# Patient Record
Sex: Female | Born: 1960
Health system: Southern US, Community
[De-identification: ages and names within clinical notes are randomized; demographics above are authoritative.]

## PROBLEM LIST (undated history)

## (undated) DIAGNOSIS — I10 Essential (primary) hypertension: Secondary | ICD-10-CM

## (undated) DIAGNOSIS — R519 Headache, unspecified: Secondary | ICD-10-CM

## (undated) DIAGNOSIS — R51 Headache: Secondary | ICD-10-CM

## (undated) DIAGNOSIS — I209 Angina pectoris, unspecified: Secondary | ICD-10-CM

## (undated) HISTORY — PX: TUBAL LIGATION: SHX77

---

## 2001-06-12 ENCOUNTER — Encounter: Payer: Self-pay | Admitting: Family Medicine

## 2001-06-12 ENCOUNTER — Encounter: Admission: RE | Admit: 2001-06-12 | Discharge: 2001-06-12 | Payer: Self-pay | Admitting: Family Medicine

## 2001-07-06 ENCOUNTER — Encounter: Payer: Self-pay | Admitting: Family Medicine

## 2001-07-06 ENCOUNTER — Encounter: Admission: RE | Admit: 2001-07-06 | Discharge: 2001-07-06 | Payer: Self-pay | Admitting: Family Medicine

## 2003-07-29 ENCOUNTER — Encounter: Admission: RE | Admit: 2003-07-29 | Discharge: 2003-07-29 | Payer: Self-pay | Admitting: Family Medicine

## 2004-06-01 ENCOUNTER — Ambulatory Visit: Payer: Self-pay | Admitting: Otolaryngology

## 2004-07-23 ENCOUNTER — Other Ambulatory Visit: Admission: RE | Admit: 2004-07-23 | Discharge: 2004-07-23 | Payer: Self-pay | Admitting: Family Medicine

## 2005-06-04 ENCOUNTER — Encounter: Admission: RE | Admit: 2005-06-04 | Discharge: 2005-06-04 | Payer: Self-pay | Admitting: Family Medicine

## 2005-06-13 ENCOUNTER — Other Ambulatory Visit: Admission: RE | Admit: 2005-06-13 | Discharge: 2005-06-13 | Payer: Self-pay | Admitting: Family Medicine

## 2006-02-06 ENCOUNTER — Ambulatory Visit: Payer: Self-pay | Admitting: Oncology

## 2006-05-06 ENCOUNTER — Ambulatory Visit: Payer: Self-pay | Admitting: Oncology

## 2006-05-08 LAB — CBC & DIFF AND RETIC
BASO%: 0.9 % (ref 0.0–2.0)
Basophils Absolute: 0 10*3/uL (ref 0.0–0.1)
EOS%: 0.7 % (ref 0.0–7.0)
MCH: 30 pg (ref 26.0–34.0)
MONO#: 0.4 10*3/uL (ref 0.1–0.9)
NEUT%: 51 % (ref 39.6–76.8)
Platelets: 283 10*3/uL (ref 145–400)
RBC: 3.3 10*6/uL — ABNORMAL LOW (ref 3.70–5.32)
RDW: 15.3 % — ABNORMAL HIGH (ref 11.3–14.5)
RETIC #: 45.5 10*3/uL (ref 19.7–115.1)
WBC: 2.9 10*3/uL — ABNORMAL LOW (ref 3.9–10.0)
lymph#: 1 10*3/uL (ref 0.9–3.3)

## 2006-05-09 LAB — SPEP & IFE WITH QIG
Albumin ELP: 48.9 % — ABNORMAL LOW (ref 55.8–66.1)
Beta Globulin: 6.1 % (ref 4.7–7.2)
IgA: 277 mg/dL (ref 68–378)
IgM, Serum: 87 mg/dL (ref 60–263)

## 2006-05-09 LAB — IRON AND TIBC
%SAT: 15 % — ABNORMAL LOW (ref 20–55)
TIBC: 394 ug/dL (ref 250–470)

## 2006-05-09 LAB — COMPREHENSIVE METABOLIC PANEL
ALT: 9 U/L (ref 0–40)
Albumin: 4.3 g/dL (ref 3.5–5.2)
BUN: 12 mg/dL (ref 6–23)
CO2: 27 mEq/L (ref 19–32)
Chloride: 101 mEq/L (ref 96–112)
Creatinine, Ser: 0.78 mg/dL (ref 0.40–1.20)
Glucose, Bld: 110 mg/dL — ABNORMAL HIGH (ref 70–99)
Total Protein: 8.3 g/dL (ref 6.0–8.3)

## 2006-05-09 LAB — ERYTHROPOIETIN: Erythropoietin: 33.6 m[IU]/mL (ref 2.6–34.0)

## 2006-06-05 ENCOUNTER — Encounter: Admission: RE | Admit: 2006-06-05 | Discharge: 2006-06-05 | Payer: Self-pay | Admitting: Family Medicine

## 2006-07-29 ENCOUNTER — Ambulatory Visit: Payer: Self-pay | Admitting: Oncology

## 2006-08-26 LAB — CBC WITH DIFFERENTIAL/PLATELET
BASO%: 0.7 % (ref 0.0–2.0)
Basophils Absolute: 0 10*3/uL (ref 0.0–0.1)
HCT: 33.2 % — ABNORMAL LOW (ref 34.8–46.6)
HGB: 11.2 g/dL — ABNORMAL LOW (ref 11.6–15.9)
LYMPH%: 35.6 % (ref 14.0–48.0)
MCH: 31.1 pg (ref 26.0–34.0)
MCHC: 33.6 g/dL (ref 32.0–36.0)
MCV: 92.5 fL (ref 81.0–101.0)
NEUT#: 1.7 10*3/uL (ref 1.5–6.5)
RBC: 3.59 10*6/uL — ABNORMAL LOW (ref 3.70–5.32)
RDW: 13.9 % (ref 11.3–14.5)
WBC: 3.5 10*3/uL — ABNORMAL LOW (ref 3.9–10.0)
lymph#: 1.3 10*3/uL (ref 0.9–3.3)

## 2006-08-26 LAB — IRON AND TIBC: %SAT: 28 % (ref 20–55)

## 2007-06-08 ENCOUNTER — Encounter: Admission: RE | Admit: 2007-06-08 | Discharge: 2007-06-08 | Payer: Self-pay | Admitting: Family Medicine

## 2007-07-28 ENCOUNTER — Other Ambulatory Visit: Admission: RE | Admit: 2007-07-28 | Discharge: 2007-07-28 | Payer: Self-pay | Admitting: Family Medicine

## 2008-06-08 ENCOUNTER — Encounter: Admission: RE | Admit: 2008-06-08 | Discharge: 2008-06-08 | Payer: Self-pay | Admitting: Family Medicine

## 2008-08-01 ENCOUNTER — Other Ambulatory Visit: Admission: RE | Admit: 2008-08-01 | Discharge: 2008-08-01 | Payer: Self-pay | Admitting: Family Medicine

## 2008-09-12 ENCOUNTER — Encounter (HOSPITAL_COMMUNITY): Admission: RE | Admit: 2008-09-12 | Discharge: 2008-12-11 | Payer: Self-pay | Admitting: Family Medicine

## 2008-12-14 ENCOUNTER — Encounter: Admission: RE | Admit: 2008-12-14 | Discharge: 2008-12-14 | Payer: Self-pay | Admitting: Family Medicine

## 2009-06-15 ENCOUNTER — Encounter: Admission: RE | Admit: 2009-06-15 | Discharge: 2009-06-15 | Payer: Self-pay | Admitting: Family Medicine

## 2010-06-07 ENCOUNTER — Encounter: Admission: RE | Admit: 2010-06-07 | Discharge: 2010-06-07 | Payer: Self-pay | Admitting: Family Medicine

## 2011-05-03 ENCOUNTER — Other Ambulatory Visit: Payer: Self-pay | Admitting: Family Medicine

## 2011-05-03 DIAGNOSIS — Z1231 Encounter for screening mammogram for malignant neoplasm of breast: Secondary | ICD-10-CM

## 2011-06-10 ENCOUNTER — Ambulatory Visit
Admission: RE | Admit: 2011-06-10 | Discharge: 2011-06-10 | Disposition: A | Discharge status: Home / Self Care | Payer: Federal, State, Local not specified - PPO | Source: Ambulatory Visit | Attending: Family Medicine | Admitting: Family Medicine

## 2011-06-10 DIAGNOSIS — Z1231 Encounter for screening mammogram for malignant neoplasm of breast: Secondary | ICD-10-CM

## 2012-05-15 ENCOUNTER — Other Ambulatory Visit: Payer: Self-pay | Admitting: Family Medicine

## 2012-05-15 DIAGNOSIS — Z1231 Encounter for screening mammogram for malignant neoplasm of breast: Secondary | ICD-10-CM

## 2012-06-10 ENCOUNTER — Ambulatory Visit: Payer: Federal, State, Local not specified - PPO

## 2012-06-11 ENCOUNTER — Ambulatory Visit
Admission: RE | Admit: 2012-06-11 | Discharge: 2012-06-11 | Disposition: A | Discharge status: Home / Self Care | Payer: Federal, State, Local not specified - PPO | Source: Ambulatory Visit | Attending: Family Medicine | Admitting: Family Medicine

## 2012-06-11 DIAGNOSIS — Z1231 Encounter for screening mammogram for malignant neoplasm of breast: Secondary | ICD-10-CM

## 2012-08-06 ENCOUNTER — Other Ambulatory Visit (HOSPITAL_COMMUNITY)
Admission: RE | Admit: 2012-08-06 | Discharge: 2012-08-06 | Disposition: A | Discharge status: Home / Self Care | Payer: Federal, State, Local not specified - PPO | Source: Ambulatory Visit | Attending: Family Medicine | Admitting: Family Medicine

## 2012-08-06 DIAGNOSIS — Z0000 Encounter for general adult medical examination without abnormal findings: Secondary | ICD-10-CM | POA: Insufficient documentation

## 2013-05-06 ENCOUNTER — Other Ambulatory Visit: Payer: Self-pay

## 2013-05-06 DIAGNOSIS — Z1231 Encounter for screening mammogram for malignant neoplasm of breast: Secondary | ICD-10-CM

## 2013-06-01 ENCOUNTER — Ambulatory Visit
Admission: RE | Admit: 2013-06-01 | Discharge: 2013-06-01 | Disposition: A | Discharge status: Home / Self Care | Payer: Federal, State, Local not specified - PPO | Source: Ambulatory Visit

## 2013-06-01 DIAGNOSIS — Z1231 Encounter for screening mammogram for malignant neoplasm of breast: Secondary | ICD-10-CM

## 2013-06-30 ENCOUNTER — Other Ambulatory Visit: Payer: Self-pay | Admitting: Family Medicine

## 2013-06-30 DIAGNOSIS — N83209 Unspecified ovarian cyst, unspecified side: Secondary | ICD-10-CM

## 2013-07-01 ENCOUNTER — Ambulatory Visit
Admission: RE | Admit: 2013-07-01 | Discharge: 2013-07-01 | Disposition: A | Discharge status: Home / Self Care | Payer: Federal, State, Local not specified - PPO | Source: Ambulatory Visit | Attending: Family Medicine | Admitting: Family Medicine

## 2013-07-01 ENCOUNTER — Other Ambulatory Visit: Payer: Self-pay | Admitting: Family Medicine

## 2013-07-01 DIAGNOSIS — N83209 Unspecified ovarian cyst, unspecified side: Secondary | ICD-10-CM

## 2013-07-02 ENCOUNTER — Other Ambulatory Visit: Payer: Federal, State, Local not specified - PPO

## 2013-12-13 ENCOUNTER — Emergency Department (INDEPENDENT_AMBULATORY_CARE_PROVIDER_SITE_OTHER)
Admission: EM | Admit: 2013-12-13 | Discharge: 2013-12-13 | Disposition: A | Discharge status: Home / Self Care | Payer: Federal, State, Local not specified - PPO | Source: Home / Self Care | Attending: Emergency Medicine | Admitting: Emergency Medicine

## 2013-12-13 ENCOUNTER — Encounter (HOSPITAL_COMMUNITY): Payer: Self-pay | Admitting: Emergency Medicine

## 2013-12-13 ENCOUNTER — Emergency Department (INDEPENDENT_AMBULATORY_CARE_PROVIDER_SITE_OTHER): Payer: Federal, State, Local not specified - PPO

## 2013-12-13 DIAGNOSIS — R079 Chest pain, unspecified: Secondary | ICD-10-CM

## 2013-12-13 MED ORDER — FAMOTIDINE 20 MG PO TABS: 20.0000 mg | ORAL_TABLET | Freq: Two times a day (BID) | ORAL | Status: DC

## 2013-12-13 NOTE — ED Provider Notes (Signed)
Medical screening examination/treatment/procedure(s) were performed by non-physician practitioner and as supervising physician I was immediately available for consultation/collaboration.  Philipp Deputy, M.D.  Harden Mo, MD 12/13/13 [phone]

## 2013-12-13 NOTE — ED Notes (Signed)
C/o chest pain which started yesterday States she had mid chest pain States pain did go away after less than 5 minutes Yesterday she did take a sudafed for her sinus which she does every now and then for her sinus States no heavy lifting States she does work at [employer] Pain is not radiating to any arms Denies any nausea, diarrhea, vomiting States she does have head pressure

## 2013-12-13 NOTE — Discharge Instructions (Signed)
Both you EKG and chest xray were normal. You may have some mild acid reflux and can begin taking Pepcid as prescribed. Please carefully review all discharge instructions below. If symptoms become suddenly worse or severe, report to your nearest ER for immediate re-evaluation. I would like you to contact your primary care physician for follow up evaluation in the next 5-7 days as your blood pressure is elevated here today. Chest Pain (Nonspecific) It is often hard to give a specific diagnosis for the cause of chest pain. There is always a chance that your pain could be related to something serious, such as a heart attack or a blood clot in the lungs. You need to follow up with your caregiver for further evaluation. CAUSES   Heartburn.  Pneumonia or bronchitis.  Anxiety or stress.  Inflammation around your heart (pericarditis) or lung (pleuritis or pleurisy).  A blood clot in the lung.  A collapsed lung (pneumothorax). It can develop suddenly on its own (spontaneous pneumothorax) or from injury (trauma) to the chest.  Shingles infection (herpes zoster virus). The chest wall is composed of bones, muscles, and cartilage. Any of these can be the source of the pain.  The bones can be bruised by injury.  The muscles or cartilage can be strained by coughing or overwork.  The cartilage can be affected by inflammation and become sore (costochondritis). DIAGNOSIS  Lab tests or other studies, such as X-rays, electrocardiography, stress testing, or cardiac imaging, may be needed to find the cause of your pain.  TREATMENT   Treatment depends on what may be causing your chest pain. Treatment may include:  Acid blockers for heartburn.  Anti-inflammatory medicine.  Pain medicine for inflammatory conditions.  Antibiotics if an infection is present.  You may be advised to change lifestyle habits. This includes stopping smoking and avoiding alcohol, caffeine, and chocolate.  You may be advised  to keep your head raised (elevated) when sleeping. This reduces the chance of acid going backward from your stomach into your esophagus.  Most of the time, nonspecific chest pain will improve within 2 to 3 days with rest and mild pain medicine. HOME CARE INSTRUCTIONS   If antibiotics were prescribed, take your antibiotics as directed. Finish them even if you start to feel better.  For the next few days, avoid physical activities that bring on chest pain. Continue physical activities as directed.  Do not smoke.  Avoid drinking alcohol.  Only take over-the-counter or prescription medicine for pain, discomfort, or fever as directed by your caregiver.  Follow your caregiver's suggestions for further testing if your chest pain does not go away.  Keep any follow-up appointments you made. If you do not go to an appointment, you could develop lasting (chronic) problems with pain. If there is any problem keeping an appointment, you must call to reschedule. SEEK MEDICAL CARE IF:   You think you are having problems from the medicine you are taking. Read your medicine instructions carefully.  Your chest pain does not go away, even after treatment.  You develop a rash with blisters on your chest. SEEK IMMEDIATE MEDICAL CARE IF:   You have increased chest pain or pain that spreads to your arm, neck, jaw, back, or abdomen.  You develop shortness of breath, an increasing cough, or you are coughing up blood.  You have severe back or abdominal pain, feel nauseous, or vomit.  You develop severe weakness, fainting, or chills.  You have a fever. THIS IS AN EMERGENCY. Do not  wait to see if the pain will go away. Get medical help at once. Call your local emergency services (911 in [place] Do not drive yourself to the hospital. MAKE SURE YOU:   Understand these instructions.  Will watch your condition.  Will get help right away if you are not doing well or get worse. Document Released: 05/15/2005  Document Revised: 10/28/2011 Document Reviewed: 03/10/2008 [facility] Patient Information 2014 [place].  Chest Pain Observation It is often hard to give a specific diagnosis for the cause of chest pain. Among other possibilities your symptoms might be caused by inadequate oxygen delivery to your heart (angina). Angina that is not treated or evaluated can lead to a heart attack (myocardial infarction) or death. Blood tests, electrocardiograms, and X-rays may have been done to help determine a possible cause of your chest pain. After evaluation and observation, your health care provider has determined that it is unlikely your pain was caused by an unstable condition that requires hospitalization. However, a full evaluation of your pain may need to be completed, with additional diagnostic testing as directed. It is very important to keep your follow-up appointments. Not keeping your follow-up appointments could result in permanent heart damage, disability, or death. If there is any problem keeping your follow-up appointments, you must call your health care provider. HOME CARE INSTRUCTIONS  Due to the slight chance that your pain could be angina, it is important to follow your health care provider's treatment plan and also maintain a healthy lifestyle:  Maintain or work toward achieving a healthy weight.  Stay physically active and exercise regularly.  Decrease your salt intake.  Eat a balanced, healthy diet. Talk to a dietician to learn about heart healthy foods.  Increase your fiber intake by including whole grains, vegetables, fruits, and nuts in your diet.  Avoid situations that cause stress, anger, or depression.  Take medicines as advised by your health care provider. Report any side effects to your health care provider. Do not stop medicines or adjust the dosages on your own.  Quit smoking. Do not use nicotine patches or gum until you check with your health care provider.  Keep your  blood pressure, blood sugar, and cholesterol levels within normal limits.  Limit alcohol intake to no more than 1 drink per day for women that are not pregnant and 2 drinks per day for men.  Do not abuse drugs. SEEK IMMEDIATE MEDICAL CARE IF: You have severe chest pain or pressure which may include symptoms such as:  You feel pain or pressure in you arms, neck, jaw, or back.  You have severe back or abdominal pain, feel sick to your stomach (nauseous), or throw up (vomit).  You are sweating profusely.  You are having a fast or irregular heartbeat.  You feel short of breath while at rest.  You notice increasing shortness of breath during rest, sleep, or with activity.  You have chest pain that does not get better after rest or after taking your usual medicine.  You wake from sleep with chest pain.  You are unable to sleep because you cannot breathe.  You develop a frequent cough or you are coughing up blood.  You feel dizzy, faint, or experience extreme fatigue.  You develop severe weakness, dizziness, fainting, or chills. Any of these symptoms may represent a serious problem that is an emergency. Do not wait to see if the symptoms will go away. Call your local emergency services (911 in the [place] Do not drive yourself  to the hospital. MAKE SURE YOU:  Understand these instructions.  Will watch your condition.  Will get help right away if you are not doing well or get worse. Document Released: 09/07/2010 Document Revised: 04/07/2013 Document Reviewed: 02/04/2013 [facility] Patient Information 2014 [place], [place].  Heart Attack in Women Heart attack (myocardial infarction) is one of the leading causes of sudden, unexpected death in women. A heart attack is damage to the heart that is not reversible. A heart attack usually occurs when a heart (coronary) artery becomes narrowed or blocked. The blockage cuts off blood supply to the heart muscle. When one or more of the coronary  arteries becomes blocked, that area of the heart begins to die. This can cause pain felt during a heart attack.  If you think you might be having a heart attack, do not ignore your symptoms. Call your local emergency services (911 in [place] immediately. It is recommended that you take a 162 mg non-enteric coated aspirin if you do not have an aspirin allergy. Do not drive yourself to the hospital or wait to see if your symptoms go away. Early recognition of heart attack symptoms is critical. The sooner a heart attack is treated, the greater the amount of heart muscle saved. Time is muscle. It can save your life. CAUSES  A heart attack can occur from coronary artery disease (CAD). CAD is a process in which the coronary arteries narrow or become blocked from the development of atherosclerosis. Atherosclerosis is a disease in which plaque builds up on the inside of the coronary arteries. Plaque is made up of fats (lipids), cholesterol, calcium, and fibrous tissue. A heart attack can occur due to:  Plaque buildup that can severely narrow or block the coronary arteries and diminish blood flow.  Plaque that can become unstable and "rupture." Unstable plaque that ruptures within a coronary artery can form a clot and cause a sudden (acute) blockage of the coronary artery.  A severe tightening (spasm) of the coronary artery. This is a less common cause of a heart attack. When a coronary artery spasms, it cuts off blood flow through the coronary artery. Spasms can occur in coronary arteries that do not have atherosclerosis. RISK FACTORS In women, as the level of estrogen in the blood decreases after menopause, the risk of a heart attack increases. Other risk factors of heart attack in women include:  High blood pressure.  High cholesterol levels.  Menopause.  Smoking.  Obesity.  Diabetes.  Hysterectomy.  Previous heart attack.  Lack of regular exercise.  Family history of heart attacks. SYMPTOMS    In women, heart attack symptoms may be different than those in men. Women may not experience the typical chest discomfort or pain, which is considered the primary heart attack symptom in men. Women may describe a feeling of pressure, ache, or tightness in the chest. Women may experience new or different physical symptoms 1 month or more before a heart attack. Unusual, unexplained fatigue may be the most frequently identified symptom. Sleep disturbances and weakness in the arms may also be considered warning signs.  Other heart attack symptoms that may occur more often in women are:  Unexplained feelings of nervousness or anxiety.  Discomfort between the shoulder blades.  Tingling in the hands and arms.  Swollen arms.  Headaches. Heart attack symptoms for both men and women include:  Pain or discomfort spreading to the neck, shoulder, arm, or jaw.  Shortness of breath.  Sudden cold sweats.  Pain or discomfort  in the abdomen.  Heartburn or indigestion with or without vomiting.  Sudden lightheadedness.  Sudden fainting or blackout. PREVENTION The following healthy lifestyle habits may help decrease your risk of heart attacks:  Quitting smoking.  Keeping your blood pressure, blood sugar, and cholesterol levels within normal limits.  Maintaining a healthy weight.  Staying physically active and exercising regularly.  Decreasing your salt intake.  Eating a diet low in saturated fats and cholesterol.  Increasing your fiber intake by including whole grains, vegetables, and fruits in your diet.  Avoiding situations that cause stress, anger, or depression.  Taking medicine as advised by your caregiver. SEEK IMMEDIATE MEDICAL CARE IF:   You have severe chest pain, especially if the pain is crushing or pressure-like and spreads to the arms, back, neck, or jaw. This is an emergency. Do not wait to see if the pain will go away. Call your local emergency services (911 in [place]  immediately. Do not drive yourself to the hospital.  You develop shortness of breath during rest, sleep, or with activity.  You have sudden, unexplained sweating or clammy skin.  You feel nauseous or vomit without cause.  You become lightheaded or dizzy.  You feel your heart beating rapidly or you notice your heart "skipping" beats. MAKE SURE YOU:   Understand these instructions.  Will watch your condition.  Will get help right away if you are not doing well or get worse. Document Released: 02/01/2008 Document Revised: 02/04/2012 Document Reviewed: 11/07/2011 [facility] Patient Information 2014 [place].  Pain of Unknown Etiology (Pain Without a Known Cause) You have come to your caregiver because of pain. Pain can occur in any part of the body. Often there is not a definite cause. If your laboratory (blood or urine) work was normal and X-rays or other studies were normal, your caregiver may treat you without knowing the cause of the pain. An example of this is the headache. Most headaches are diagnosed by taking a history. This means your caregiver asks you questions about your headaches. Your caregiver determines a treatment based on your answers. Usually testing done for headaches is normal. Often testing is not done unless there is no response to medications. Regardless of where your pain is located today, you can be given medications to make you comfortable. If no physical cause of pain can be found, most cases of pain will gradually leave as suddenly as they came.  If you have a painful condition and no reason can be found for the pain, it is important that you follow up with your caregiver. If the pain becomes worse or does not go away, it may be necessary to repeat tests and look further for a possible cause.  Only take over-the-counter or prescription medicines for pain, discomfort, or fever as directed by your caregiver.  For the protection of your privacy, test results  cannot be given over the phone. Make sure you receive the results of your test. Ask how these results are to be obtained if you have not been informed. It is your responsibility to obtain your test results.  You may continue all activities unless the activities cause more pain. When the pain lessens, it is important to gradually resume normal activities. Resume activities by beginning slowly and gradually increasing the intensity and duration of the activities or exercise. During periods of severe pain, bed rest may be helpful. Lie or sit in any position that is comfortable.  Ice used for acute (sudden) conditions may be effective.  Use a large plastic bag filled with ice and wrapped in a towel. This may provide pain relief.  See your caregiver for continued problems. Your caregiver can help or refer you for exercises or physical therapy if necessary. If you were given medications for your condition, do not drive, operate machinery or power tools, or sign legal documents for 24 hours. Do not drink alcohol, take sleeping pills, or take other medications that may interfere with treatment. See your caregiver immediately if you have pain that is becoming worse and not relieved by medications. Document Released: 04/30/2001 Document Revised: 05/26/2013 Document Reviewed: 08/05/2005 [facility] Patient Information 2014 [place].

## 2013-12-13 NOTE — ED Provider Notes (Signed)
CSN: [id number]     Arrival date & time 12/13/13  3086 History   First MD Initiated Contact with Patient 12/13/13 0845     Chief Complaint  Patient presents with  . Chest Pain   (Consider location/radiation/quality/duration/timing/severity/associated sxs/prior Treatment) HPI Comments: Patient report a 3-5 minute episode of dull substernal chest discomfort yesterday while at church. Decided to leave church early as a result but discomfort had resolved spontaneously before arriving home. States she had a second similar episode this morning while getting ready for work. It also lasted about 3 minutes and resolved PTA. No associated dyspnea, diaphoresis, nausea, cough, fever, pedal edema, palpitations, dizziness, or syncope associated with either episode. Pain does not change with exertion.  No known personal hx of CAD. Family HX: father had fatal MI in his mid-60's Also mentions that over the weekend she ate sausage, biscuits, pizza, and hot wings.  Works at [employer]. Patient is a non-smoker.  PCP: Dr. Deland Pretty Reports herself to be symptom-free at time of exam. Symptoms do not seem to be precipitated by exertion.   Patient is a 53 y.o. female presenting with chest pain. The history is provided by the patient.  Chest Pain Pain location:  Substernal area Pain quality: dull   Pain radiates to:  Does not radiate Pain radiates to the back: no   Pain severity:  Mild Onset quality:  Gradual Duration:  3 minutes Progression:  Resolved Chronicity:  New Associated symptoms: no cough, no dizziness, no headache, no shortness of breath and no weakness     History reviewed. No pertinent past medical history. No past surgical history on file. No family history on file. History  Substance Use Topics  . Smoking status: Not on file  . Smokeless tobacco: Not on file  . Alcohol Use: Not on file   OB History   Grav Para Term Preterm Abortions TAB SAB Ect Mult Living                 Review of  Systems  Constitutional: Negative.   HENT: Positive for sinus pressure.   Eyes: Negative.   Respiratory: Positive for chest tightness. Negative for cough, shortness of breath, wheezing and stridor.   Cardiovascular: Positive for chest pain.  Gastrointestinal: Negative.   Endocrine: Negative for polydipsia, polyphagia and polyuria.  Genitourinary: Negative.   Musculoskeletal: Negative.   Skin: Negative.   Neurological: Negative for dizziness, seizures, syncope, weakness, light-headedness and headaches.  Hematological: Negative for adenopathy.    Allergies  Rubbing alcohol  Home Medications   Prior to Admission medications   Not on File   BP 156/98  Pulse 78  Temp(Src) 98.2 F (36.8 C) (Oral)  Resp 12  SpO2 97% Physical Exam  Nursing note and vitals reviewed. Constitutional: She is oriented to person, place, and time. She appears well-developed and well-nourished.  +hypertensive  HENT:  Head: Normocephalic and atraumatic.  Eyes: Conjunctivae are normal. No scleral icterus.  Neck: Normal range of motion. Neck supple. No JVD present. No thyromegaly present.  Cardiovascular: Normal rate, regular rhythm and normal heart sounds.   Pulmonary/Chest: Effort normal and breath sounds normal. No respiratory distress. She has no wheezes.  Abdominal: Soft. Bowel sounds are normal. She exhibits no distension. There is no tenderness.  Musculoskeletal: Normal range of motion. She exhibits no edema and no tenderness.  Neurological: She is alert and oriented to person, place, and time.  Skin: Skin is warm and dry.  Psychiatric: She has a normal mood  and affect. Her behavior is normal.    ED Course  Procedures (including critical care time) Labs Review Labs Reviewed - No data to display  Imaging Review Dg Chest 2 View  12/13/2013   CLINICAL DATA:  Chest pain  EXAM: CHEST  2 VIEW  COMPARISON:  None.  FINDINGS: The heart size and mediastinal contours are within normal limits. Both lungs  are clear. The visualized skeletal structures are unremarkable.  IMPRESSION: No active cardiopulmonary disease.   Electronically Signed   By: Daryll Brod M.D.   On: 12/13/2013 09:32     MDM   1. Nonspecific chest pain    ECG: NSR at 75 bmp. No acute ST/T wave changes or ectopy.  CXR: normal. Will suggest that patient begin taking Pepcid BID as prescribed and contact PCP for follow up evaluation should symptoms re-occur. Also recommended PCP follow up for elevated BP.  Hx and exam do not seem to suggest new onset or unstable angina or pulmonary embolism.  Patient understand that if symptoms become suddenly worse, persistent or associated with dyspnea, dizziness, diaphoresis, weakness, or nausea, she is to seek immediate medical attention at her nearest ER.   [place], [place] 12/13/13 [phone]

## 2014-04-29 ENCOUNTER — Other Ambulatory Visit: Payer: Self-pay

## 2014-04-29 DIAGNOSIS — Z1231 Encounter for screening mammogram for malignant neoplasm of breast: Secondary | ICD-10-CM

## 2014-05-19 ENCOUNTER — Ambulatory Visit
Admission: RE | Admit: 2014-05-19 | Discharge: 2014-05-19 | Disposition: A | Discharge status: Home / Self Care | Payer: Federal, State, Local not specified - PPO | Source: Ambulatory Visit

## 2014-05-19 DIAGNOSIS — Z1231 Encounter for screening mammogram for malignant neoplasm of breast: Secondary | ICD-10-CM

## 2015-01-10 ENCOUNTER — Other Ambulatory Visit: Payer: Self-pay | Admitting: Family Medicine

## 2015-01-10 ENCOUNTER — Other Ambulatory Visit (HOSPITAL_COMMUNITY)
Admission: RE | Admit: 2015-01-10 | Discharge: 2015-01-10 | Disposition: A | Discharge status: Home / Self Care | Payer: Federal, State, Local not specified - PPO | Source: Ambulatory Visit | Attending: Family Medicine | Admitting: Family Medicine

## 2015-01-10 DIAGNOSIS — Z01419 Encounter for gynecological examination (general) (routine) without abnormal findings: Secondary | ICD-10-CM | POA: Insufficient documentation

## 2015-01-10 DIAGNOSIS — E010 Iodine-deficiency related diffuse (endemic) goiter: Secondary | ICD-10-CM

## 2015-01-12 LAB — CYTOLOGY - PAP

## 2015-01-17 ENCOUNTER — Other Ambulatory Visit: Payer: Federal, State, Local not specified - PPO

## 2015-01-20 ENCOUNTER — Other Ambulatory Visit: Payer: Federal, State, Local not specified - PPO

## 2015-03-16 ENCOUNTER — Ambulatory Visit
Admission: RE | Admit: 2015-03-16 | Discharge: 2015-03-16 | Disposition: A | Discharge status: Home / Self Care | Payer: Federal, State, Local not specified - PPO | Source: Ambulatory Visit | Attending: Family Medicine | Admitting: Family Medicine

## 2015-03-16 DIAGNOSIS — E010 Iodine-deficiency related diffuse (endemic) goiter: Secondary | ICD-10-CM

## 2015-03-20 ENCOUNTER — Other Ambulatory Visit: Payer: Self-pay | Admitting: Family Medicine

## 2015-03-20 DIAGNOSIS — E041 Nontoxic single thyroid nodule: Secondary | ICD-10-CM

## 2015-04-06 ENCOUNTER — Inpatient Hospital Stay: Admission: RE | Admit: 2015-04-06 | Payer: Federal, State, Local not specified - PPO | Source: Ambulatory Visit

## 2015-04-11 ENCOUNTER — Other Ambulatory Visit: Payer: Federal, State, Local not specified - PPO

## 2015-05-16 ENCOUNTER — Other Ambulatory Visit: Payer: Self-pay

## 2015-05-16 DIAGNOSIS — Z1231 Encounter for screening mammogram for malignant neoplasm of breast: Secondary | ICD-10-CM

## 2015-05-25 ENCOUNTER — Ambulatory Visit: Payer: Federal, State, Local not specified - PPO

## 2015-06-02 ENCOUNTER — Ambulatory Visit
Admission: RE | Admit: 2015-06-02 | Discharge: 2015-06-02 | Disposition: A | Discharge status: Home / Self Care | Payer: Federal, State, Local not specified - PPO | Source: Ambulatory Visit

## 2015-06-02 DIAGNOSIS — Z1231 Encounter for screening mammogram for malignant neoplasm of breast: Secondary | ICD-10-CM

## 2015-07-18 ENCOUNTER — Ambulatory Visit (HOSPITAL_COMMUNITY)
Admission: RE | Admit: 2015-07-18 | Payer: Federal, State, Local not specified - PPO | Source: Ambulatory Visit | Admitting: Obstetrics and Gynecology

## 2015-07-18 ENCOUNTER — Encounter (HOSPITAL_COMMUNITY): Admission: RE | Payer: Self-pay | Source: Ambulatory Visit

## 2015-07-18 SURGERY — SALPINGO-OOPHORECTOMY, UNILATERAL, LAPAROSCOPIC
Anesthesia: Choice | Laterality: Right

## 2015-08-15 ENCOUNTER — Encounter (HOSPITAL_COMMUNITY): Payer: Self-pay

## 2015-08-15 ENCOUNTER — Encounter (HOSPITAL_COMMUNITY)
Admission: RE | Admit: 2015-08-15 | Discharge: 2015-08-15 | Disposition: A | Discharge status: Home / Self Care | Payer: Federal, State, Local not specified - PPO | Source: Ambulatory Visit | Attending: Obstetrics and Gynecology | Admitting: Obstetrics and Gynecology

## 2015-08-15 ENCOUNTER — Other Ambulatory Visit: Payer: Self-pay

## 2015-08-15 ENCOUNTER — Inpatient Hospital Stay (HOSPITAL_COMMUNITY): Admission: RE | Admit: 2015-08-15 | Payer: Federal, State, Local not specified - PPO | Source: Ambulatory Visit

## 2015-08-15 DIAGNOSIS — N83201 Unspecified ovarian cyst, right side: Secondary | ICD-10-CM | POA: Insufficient documentation

## 2015-08-15 DIAGNOSIS — D259 Leiomyoma of uterus, unspecified: Secondary | ICD-10-CM | POA: Insufficient documentation

## 2015-08-15 DIAGNOSIS — Z01818 Encounter for other preprocedural examination: Secondary | ICD-10-CM | POA: Insufficient documentation

## 2015-08-15 HISTORY — DX: Headache, unspecified: R51.9

## 2015-08-15 HISTORY — DX: Angina pectoris, unspecified: I20.9

## 2015-08-15 HISTORY — DX: Headache: R51

## 2015-08-15 NOTE — Patient Instructions (Signed)
            Your procedure is scheduled on: Aug 22 2014  Enter through the Main Entrance of [facility] at: [place] up the phone at the desk and dial [phone] and inform us of your arrival.  Please call this number if you have any problems the morning of surgery: [phone]  Remember: Do not eat food after midnight: JAN 3 (TUESDAY) Do not drink clear liquids after:  [place]  Take these medicines the morning of surgery with a SIP OF WATER:  Do not wear jewelry, make-up, or FINGER nail polish No metal in your hair or on your body. Do not wear lotions, powders, perfumes.  You may wear deodorant.  Do not bring valuables to the hospital. Contacts, dentures or bridgework may not be worn into surgery.  Leave suitcase in the car. After Surgery it may be brought to your room. For patients being admitted to the hospital, checkout time is 11:00am the day of discharge.

## 2015-08-22 ENCOUNTER — Other Ambulatory Visit (HOSPITAL_COMMUNITY): Payer: Self-pay | Admitting: Obstetrics and Gynecology

## 2015-08-29 ENCOUNTER — Other Ambulatory Visit (HOSPITAL_COMMUNITY): Payer: Self-pay | Admitting: Obstetrics and Gynecology

## 2015-08-29 NOTE — H&P (Signed)
Subjective:  Chief Complaint(s):   PreOp for 08/30/15   HPI:  General 55 y/o presents for preop visit in preparation for LAVH/BSO scheduled for 08/30/2015 due to fibroids and ovarian cyst. her ultrasound from 04/06/2015 shows a 7.5 cm x 4.4 cm x 3.7 cm uterus. the endometrium is 1.7 mm. Her right ovary has a simple cyst that is 3.8 cm x 3.6 cm x 2.7 cm. stable is size form previous u/s 06/06/2014. there is a solid mass on the right ovary that is 2.5 cm x 2.4 cm x 2.4 cm tha thas increased in size form previous ultrasound both are avascular. Free fluid is seen in the culdesac and the right adnexa.  CA 125 performed 05/2015 was 10.6.  Current Medication:  Taking  Losartan Potassium-HCTZ 50-12.5 MG Tablet 0.5 tablet Once a day     Biotin 1000 MCG Tablet 1 tablet Once a day     Imitrex(SUMAtriptan) 50 MG Tablet 1 tablet prn ha, mary repeat once in 2 hours as directed, Notes: prn   Not-Taking/PRN  Vitamin D 5000 Tablet 1 tablet Once a day     Tylenol Sinus Congestion/Pain(Chlorphen-PE-Acetaminophen) 5-325 MG Tablet as directed as needed, Notes: prn     Sudafed Sinus(Pseudoephedrine-Acetaminophen) as needed, Notes: prn     Medication List reviewed and reconciled with the patient   Medical History:   HTN     Tinea Vesicolor     Migraines     eczema     fibroids, right ovarian cyst on u/s 12/11, needs f/u u/s 1 year, being followed by Dr Landry Mellow 10/15, needs 1 yr f/u     thyromegally     vitamin D deficiency   Allergies/Intolerance:   Alcohol: Allergy - rash   Gyn History:   Sexual activity currently sexually active. Periods : postmenopausal. Last pap smear date 01/10/15. Last mammogram date 05/19/14. Denies Abnormal pap smear. Denies STD none. Menarche 11.   OB History:   Number of pregnancies 3. Pregnancy # 1 live birth, vaginal delivery, boy 76. Pregnancy # 2 live birth, vaginal delivery, boy 9. Pregnancy # 3 live birth twin girls , vaginal delivery 1995.   Surgical History:   BTL   Hospitalization:   childbirth 20, 33, 95   Family History:   Father: deceased 71 yrs, CAD 69, CVA 65s, DM, HTN. MI, diagnosed with DM, HTN, CVA, CAD    Mother: alive 32 yrs, HTN, DM, diagnosed with DM, HTN    Paternal Grand Father: deceased    Paternal Grand Mother: deceased    Maternal Grand Father: deceased    Maternal Grand Mother: deceased, female CA, HTN, DM, diagnosed with DM, HTN    Brother 1: alive 48 yrs, healthy    Sister 1: alive 65 yrs, healthy    Sister 2: alive 24 yrs, HTN, diagnosed with HTN    1 brother(s) , 2 sister(s) .    denies family hx colon cancer, colon polyops or liver disease.  Social History:  General Tobacco use cigarettes: Never smoked, Tobacco history last updated 08/17/2015.  no EXPOSURE TO PASSIVE SMOKE, no.  no Alcohol, no.  no Caffeine, None.  no Recreational drug use, no.  Exercise: 5-7 times per week.  Marital Status: married, Married [place].  Children: Legrand [facility]), [place] ([address] [place]), Sarah ([employer]) and [place] ([place]) (twins).  OCCUPATION: employed, [employer], husband at [employer].  Religion: Astronomer.  Seat belt use: yes.  ROS: CONSTITUTIONAL none" options="no,yes" propid="91" itemid="[id number]" categoryid="10464" encounterid="[id number]"Fatigue none.  none today" options="no,yes" propid="91" itemid="10467" categoryid="10464" encounterid="[id number]"Fever none today.  CARDIOLOGY none" options="no,yes" propid="91" itemid="[id number]" categoryid="10488" encounterid="[id number]"Chest pain none.  RESPIRATORY no" options="no" propid="91" itemid="270013" categoryid="138132" encounterid="[id number]"Shortness of breath no. no" options="no,yes" propid="91" itemid="172745" categoryid="138132" encounterid="[id number]"Cough no.  GASTROENTEROLOGY none" options="no,yes" propid="91" itemid="193447" categoryid="10494" encounterid="[id number]"Appetite change none. no" options="no,yes" propid="91" itemid="193449"  categoryid="10494" encounterid="[id number]"Change in bowel habits no.  FEMALE REPRODUCTIVE no" options="no,yes" propid="91" itemid="196298" categoryid="10525" encounterid="[id number]"Breast lumps or discharge no. none" options="no,yes" propid="91" itemid="186083" categoryid="10525" encounterid="[id number]"Breast pain none. none" options="no,yes" propid="91" itemid="138198" categoryid="10525" encounterid="[id number]"Dyspareunia none. no" options="no,yes" propid="91" itemid="202654" categoryid="10525" encounterid="[id number]"Dysuria no. none" options="no,yes" propid="91" itemid="186082" categoryid="10525" encounterid="[id number]"Pelvic pain none. NA" options="no,yes" propid="91" itemid="199173" categoryid="10525" encounterid="[id number]"Regular menses NA. no" options="no,yes" propid="91" itemid="278230" categoryid="10525" encounterid="[id number]"Unusual vaginal discharge no. no" options="no,yes" propid="91" itemid="278942" categoryid="10525" encounterid="[id number]"Vaginal itching no. no" options="no,yes" propid="91" itemid="278837" categoryid="10525" encounterid="[id number]"Vulvar/labial lesion no.  NEUROLOGY none" options="no,yes" propid="91" itemid="193627" categoryid="12512" encounterid="[id number]"Migraines none. none" options="no,yes" propid="91" itemid="12514" categoryid="12512" encounterid="[id number]"Tingling/numbness none. none" options="no,yes" propid="91" itemid="193467" categoryid="12512" encounterid="[id number]"Visual changes none.  PSYCHOLOGY no" options="" propid="91" itemid="275919" categoryid="10520" encounterid="[id number]"Depression no.  SKIN no" options="no,yes" propid="91" itemid="269383" categoryid="202750" encounterid="[id number]"Rash no. no" options="no,yes" propid="91" itemid="202757" categoryid="202750" encounterid="[id number]"Suspicious lesions no.  ENDOCRINOLOGY none" options="no,yes" propid="91" itemid="202624" categoryid="12508" encounterid="[id number]"Hot flashes none. no unintentional" options="no,yes" propid="91"  itemid="193436" categoryid="12508" encounterid="[id number]"Weight gain no unintentional. none" options="no,yes" propid="91" itemid="138164" categoryid="12508" encounterid="[id number]"Weight loss none.  HEMATOLOGY/LYMPH no" options="no,yes" propid="91" itemid="193454" categoryid="138157" encounterid="[id number]"Anemia no.    Objective:  Vitals:  Wt 140, Wt change -2 lb, Pulse sitting 82, BP sitting 118/79  Past Results:  Examination:  General Examination McCoy,Tiffany 08/17/2015 03:01:21 PM &gt; , for pelvic exam only" categoryPropId="[id number]" examid="[id number]"CHAPERONE PRESENT McCoy,Tiffany 08/17/2015 03:01:21 PM > , for pelvic exam only.  Physical Examination: GENERAL in NAD, pleasant"Patient appears in NAD, pleasant. well developed"Build: well developed. well-appearing. well-developed"General Appearance: well-appearing. well-developed. african-american"Race: african-american.  LUNGS clear to auscultation"Breath sounds: clear to auscultation. no"Dyspnea: no.  HEART none"Murmurs: none. normal"Rate: normal. regular"Rhythm: regular.  ABDOMEN no masses,tenderness,organomegaly, no CVAT"General: no masses,tenderness,organomegaly, no CVAT.  FEMALE GENITOURINARY no mass, non tender"Adnexa: no mass, non tender. normal, no lesions"Anus/perineum: normal, no lesions. normal appearance , no lesions/discharge/bleeding,good pelvic support , external os normal "Cervix/ cuff: normal appearance , no lesions/discharge/bleeding,good pelvic support , external os normal . normal, no lesions, no skin discoloration, no lymphadenopathy"External genitalia: normal, no lesions, no skin discoloration, no lymphadenopathy. normal external meatus"Urethra: normal external meatus. normal size/shape/consistency, freely mobile, non tender"Uterus: normal size/shape/consistency, freely mobile, non tender. deferred"Rectum: deferred. pink/moist mucosa, no lesions, no abnormal discharge, odorless"Vagina: pink/moist mucosa, no lesions, no abnormal  discharge, odorless. normal, no lesions, no skin discoloration, non tender"Vulva: normal, no lesions, no skin discoloration, non tender.  EXTREMITIES FROM of all extremities"Extremities FROM of all extremities.  NEUROLOGICAL normal"Gait: normal. alert and oriented x 3"Orientation: alert and oriented x 3.    Assessment:  Assessment:  Unspecified ovarian cyst, right side - N83.201 (Primary)     Leiomyoma of body of uterus - D25.9     Plan:  Treatment:  Unspecified ovarian cyst, right side  Notes: pt desires definitive therapy via hysterectomy and bso. plan for LAVH/BSO r/b/a of surgery were discussed with the patient including but not limited to infection/ bleeding damage to bowel bladder surrounding organs with the need for further surgery. r/o transfusion discussed. pt voiced understanding and desires to proceed.  Leiomyoma of body of uterus  Notes: pt desires definitive therapy via hysterectomy and bso. plan for LAVH/BSO r/b/a of surgery were discussed with the patient including but not limited to infection/ bleeding damage to bowel bladder surrounding organs with the need for further surgery. r/o transfusion discussed. pt voiced understanding  and desires to proceed.

## 2015-08-30 ENCOUNTER — Ambulatory Visit (HOSPITAL_COMMUNITY): Payer: Federal, State, Local not specified - PPO | Admitting: Certified Registered Nurse Anesthetist

## 2015-08-30 ENCOUNTER — Encounter (HOSPITAL_COMMUNITY): Payer: Self-pay | Admitting: Certified Registered Nurse Anesthetist

## 2015-08-30 ENCOUNTER — Encounter (HOSPITAL_COMMUNITY)
Admission: RE | Disposition: A | Discharge status: Home / Self Care | Payer: Self-pay | Source: Ambulatory Visit | Attending: Obstetrics and Gynecology

## 2015-08-30 ENCOUNTER — Observation Stay (HOSPITAL_COMMUNITY)
Admission: RE | Admit: 2015-08-30 | Discharge: 2015-08-31 | Disposition: A | Discharge status: Home / Self Care | Payer: Federal, State, Local not specified - PPO | Source: Ambulatory Visit | Attending: Obstetrics and Gynecology | Admitting: Obstetrics and Gynecology

## 2015-08-30 DIAGNOSIS — Z79899 Other long term (current) drug therapy: Secondary | ICD-10-CM | POA: Insufficient documentation

## 2015-08-30 DIAGNOSIS — N840 Polyp of corpus uteri: Secondary | ICD-10-CM | POA: Diagnosis not present

## 2015-08-30 DIAGNOSIS — D270 Benign neoplasm of right ovary: Secondary | ICD-10-CM | POA: Insufficient documentation

## 2015-08-30 DIAGNOSIS — N83202 Unspecified ovarian cyst, left side: Secondary | ICD-10-CM | POA: Insufficient documentation

## 2015-08-30 DIAGNOSIS — D251 Intramural leiomyoma of uterus: Secondary | ICD-10-CM | POA: Diagnosis present

## 2015-08-30 DIAGNOSIS — N83201 Unspecified ovarian cyst, right side: Secondary | ICD-10-CM | POA: Diagnosis not present

## 2015-08-30 DIAGNOSIS — D259 Leiomyoma of uterus, unspecified: Secondary | ICD-10-CM | POA: Diagnosis present

## 2015-08-30 DIAGNOSIS — D271 Benign neoplasm of left ovary: Secondary | ICD-10-CM | POA: Diagnosis not present

## 2015-08-30 DIAGNOSIS — Z90710 Acquired absence of both cervix and uterus: Secondary | ICD-10-CM | POA: Diagnosis present

## 2015-08-30 DIAGNOSIS — I10 Essential (primary) hypertension: Secondary | ICD-10-CM | POA: Diagnosis not present

## 2015-08-30 HISTORY — PX: LAPAROSCOPIC VAGINAL HYSTERECTOMY WITH SALPINGO OOPHORECTOMY: SHX6681

## 2015-08-30 HISTORY — PX: SALPINGOOPHORECTOMY: SHX82

## 2015-08-30 LAB — TYPE AND SCREEN
ABO/RH(D): O POS
ANTIBODY SCREEN: NEGATIVE

## 2015-08-30 LAB — ABO/RH: ABO/RH(D): O POS

## 2015-08-30 SURGERY — HYSTERECTOMY, VAGINAL, LAPAROSCOPY-ASSISTED, WITH SALPINGO-OOPHORECTOMY
Anesthesia: General | Site: Abdomen

## 2015-08-30 MED ORDER — LACTATED RINGERS IV SOLN
INTRAVENOUS | Status: DC
Start: 2015-08-30 — End: 2015-08-31
  Administered 2015-08-30 – 2015-08-31 (×2): via INTRAVENOUS

## 2015-08-30 MED ORDER — LACTATED RINGERS IV SOLN
INTRAVENOUS | Status: DC
  Administered 2015-08-30 (×2): via INTRAVENOUS
  Administered 2015-08-30: 125 mL/h via INTRAVENOUS

## 2015-08-30 MED ORDER — SODIUM CHLORIDE 0.9 % IJ SOLN
INTRAMUSCULAR | Status: AC
  Filled 2015-08-30: qty 50

## 2015-08-30 MED ORDER — HYDROMORPHONE HCL 1 MG/ML IJ SOLN
0.2500 mg | INTRAMUSCULAR | Status: DC | PRN
  Administered 2015-08-30 (×2): 0.25 mg via INTRAVENOUS

## 2015-08-30 MED ORDER — HYDROMORPHONE HCL 1 MG/ML IJ SOLN
0.2000 mg | INTRAMUSCULAR | Status: DC | PRN
  Administered 2015-08-30 – 2015-08-31 (×2): 0.6 mg via INTRAVENOUS
  Filled 2015-08-30 (×2): qty 1

## 2015-08-30 MED ORDER — OXYCODONE-ACETAMINOPHEN 5-325 MG PO TABS
1.0000 | ORAL_TABLET | ORAL | Status: DC | PRN
  Administered 2015-08-31: 1 via ORAL
  Filled 2015-08-30: qty 1

## 2015-08-30 MED ORDER — SCOPOLAMINE 1 MG/3DAYS TD PT72
1.0000 | MEDICATED_PATCH | Freq: Once | TRANSDERMAL | Status: DC
  Administered 2015-08-30: 1.5 mg via TRANSDERMAL

## 2015-08-30 MED ORDER — CEFAZOLIN SODIUM-DEXTROSE 2-3 GM-% IV SOLR
INTRAVENOUS | Status: AC
  Filled 2015-08-30: qty 50

## 2015-08-30 MED ORDER — DEXAMETHASONE SODIUM PHOSPHATE 10 MG/ML IJ SOLN
INTRAMUSCULAR | Status: DC | PRN
  Administered 2015-08-30: 4 mg via INTRAVENOUS

## 2015-08-30 MED ORDER — ONDANSETRON HCL 4 MG PO TABS: 4.0000 mg | ORAL_TABLET | Freq: Four times a day (QID) | ORAL | Status: DC | PRN

## 2015-08-30 MED ORDER — FENTANYL CITRATE (PF) 250 MCG/5ML IJ SOLN
INTRAMUSCULAR | Status: AC
  Filled 2015-08-30: qty 5

## 2015-08-30 MED ORDER — ONDANSETRON HCL 4 MG/2ML IJ SOLN: 4.0000 mg | Freq: Four times a day (QID) | INTRAMUSCULAR | Status: DC | PRN

## 2015-08-30 MED ORDER — EPHEDRINE 5 MG/ML INJ
INTRAVENOUS | Status: AC
  Filled 2015-08-30: qty 10

## 2015-08-30 MED ORDER — NEOSTIGMINE METHYLSULFATE 10 MG/10ML IV SOLN
INTRAVENOUS | Status: DC | PRN
  Administered 2015-08-30: 3 mg via INTRAVENOUS

## 2015-08-30 MED ORDER — GLYCOPYRROLATE 0.2 MG/ML IJ SOLN
INTRAMUSCULAR | Status: AC
  Filled 2015-08-30: qty 3

## 2015-08-30 MED ORDER — SODIUM CHLORIDE 0.9 % IV SOLN
INTRAVENOUS | Status: DC | PRN
  Administered 2015-08-30: 29 mL via INTRAMUSCULAR

## 2015-08-30 MED ORDER — HYDROMORPHONE HCL 1 MG/ML IJ SOLN
INTRAMUSCULAR | Status: AC
  Filled 2015-08-30: qty 1

## 2015-08-30 MED ORDER — SODIUM CHLORIDE 0.9 % IJ SOLN
INTRAMUSCULAR | Status: AC
  Filled 2015-08-30: qty 100

## 2015-08-30 MED ORDER — ESTRADIOL 0.1 MG/GM VA CREA
TOPICAL_CREAM | VAGINAL | Status: AC
  Filled 2015-08-30: qty 42.5

## 2015-08-30 MED ORDER — ESTRADIOL 0.1 MG/GM VA CREA
TOPICAL_CREAM | VAGINAL | Status: DC | PRN
  Administered 2015-08-30: 1 via VAGINAL

## 2015-08-30 MED ORDER — BUPIVACAINE HCL (PF) 0.25 % IJ SOLN
INTRAMUSCULAR | Status: AC
  Filled 2015-08-30: qty 30

## 2015-08-30 MED ORDER — FENTANYL CITRATE (PF) 100 MCG/2ML IJ SOLN
INTRAMUSCULAR | Status: DC | PRN
Start: 2015-08-30 — End: 2015-08-30
  Administered 2015-08-30 (×5): 50 ug via INTRAVENOUS

## 2015-08-30 MED ORDER — MIDAZOLAM HCL 2 MG/2ML IJ SOLN
INTRAMUSCULAR | Status: AC
  Filled 2015-08-30: qty 2

## 2015-08-30 MED ORDER — ONDANSETRON HCL 4 MG/2ML IJ SOLN
INTRAMUSCULAR | Status: DC | PRN
  Administered 2015-08-30: 4 mg via INTRAVENOUS

## 2015-08-30 MED ORDER — KETOROLAC TROMETHAMINE 30 MG/ML IJ SOLN
INTRAMUSCULAR | Status: DC | PRN
  Administered 2015-08-30: 30 mg via INTRAVENOUS

## 2015-08-30 MED ORDER — MENTHOL 3 MG MT LOZG: 1.0000 | LOZENGE | OROMUCOSAL | Status: DC | PRN

## 2015-08-30 MED ORDER — HYDROMORPHONE HCL 1 MG/ML IJ SOLN
INTRAMUSCULAR | Status: DC | PRN
  Administered 2015-08-30: 0.5 mg via INTRAVENOUS
  Administered 2015-08-30: .25 mg via INTRAVENOUS

## 2015-08-30 MED ORDER — KETOROLAC TROMETHAMINE 30 MG/ML IJ SOLN
INTRAMUSCULAR | Status: AC
  Filled 2015-08-30: qty 1

## 2015-08-30 MED ORDER — SCOPOLAMINE 1 MG/3DAYS TD PT72
MEDICATED_PATCH | TRANSDERMAL | Status: DC
Start: 2015-08-30 — End: 2015-08-31
  Administered 2015-08-30: 1.5 mg via TRANSDERMAL
  Filled 2015-08-30: qty 1

## 2015-08-30 MED ORDER — ONDANSETRON HCL 4 MG/2ML IJ SOLN
4.0000 mg | Freq: Four times a day (QID) | INTRAMUSCULAR | Status: DC | PRN
  Administered 2015-08-31: 4 mg via INTRAVENOUS
  Filled 2015-08-30: qty 2

## 2015-08-30 MED ORDER — SENNA 8.6 MG PO TABS
1.0000 | ORAL_TABLET | Freq: Two times a day (BID) | ORAL | Status: DC
  Administered 2015-08-30 – 2015-08-31 (×2): 8.6 mg via ORAL
  Filled 2015-08-30 (×4): qty 1

## 2015-08-30 MED ORDER — SODIUM CHLORIDE 0.9 % IV SOLN
INTRAVENOUS | Status: DC | PRN
  Administered 2015-08-30: 60 mL
  Administered 2015-08-30: 10 mL

## 2015-08-30 MED ORDER — OXYCODONE HCL 5 MG PO TABS: 5.0000 mg | ORAL_TABLET | Freq: Once | ORAL | Status: DC | PRN

## 2015-08-30 MED ORDER — NEOSTIGMINE METHYLSULFATE 10 MG/10ML IV SOLN
INTRAVENOUS | Status: AC
  Filled 2015-08-30: qty 1

## 2015-08-30 MED ORDER — LIDOCAINE HCL (CARDIAC) 20 MG/ML IV SOLN
INTRAVENOUS | Status: DC | PRN
  Administered 2015-08-30: 50 mg via INTRAVENOUS

## 2015-08-30 MED ORDER — LACTATED RINGERS IR SOLN
Status: DC | PRN
  Administered 2015-08-30: 3000 mL

## 2015-08-30 MED ORDER — OXYCODONE HCL 5 MG/5ML PO SOLN: 5.0000 mg | Freq: Once | ORAL | Status: DC | PRN

## 2015-08-30 MED ORDER — SODIUM CHLORIDE 0.9 % IJ SOLN
INTRAMUSCULAR | Status: AC
  Filled 2015-08-30: qty 10

## 2015-08-30 MED ORDER — ROPIVACAINE HCL 5 MG/ML IJ SOLN
INTRAMUSCULAR | Status: AC
  Filled 2015-08-30: qty 60

## 2015-08-30 MED ORDER — CEFAZOLIN SODIUM-DEXTROSE 2-3 GM-% IV SOLR
2.0000 g | INTRAVENOUS | Status: AC
  Administered 2015-08-30: 2 g via INTRAVENOUS

## 2015-08-30 MED ORDER — IBUPROFEN 800 MG PO TABS
800.0000 mg | ORAL_TABLET | Freq: Three times a day (TID) | ORAL | Status: DC | PRN
  Administered 2015-08-31: 800 mg via ORAL
  Filled 2015-08-30 (×3): qty 1

## 2015-08-30 MED ORDER — GLYCOPYRROLATE 0.2 MG/ML IJ SOLN
INTRAMUSCULAR | Status: DC | PRN
  Administered 2015-08-30: 0.4 mg via INTRAVENOUS

## 2015-08-30 MED ORDER — LIDOCAINE HCL (PF) 1 % IJ SOLN
INTRAMUSCULAR | Status: AC
  Filled 2015-08-30: qty 5

## 2015-08-30 MED ORDER — ONDANSETRON HCL 4 MG/2ML IJ SOLN
INTRAMUSCULAR | Status: AC
  Filled 2015-08-30: qty 2

## 2015-08-30 MED ORDER — VASOPRESSIN 20 UNIT/ML IV SOLN
INTRAVENOUS | Status: AC
  Filled 2015-08-30: qty 1

## 2015-08-30 MED ORDER — PROPOFOL 10 MG/ML IV BOLUS
INTRAVENOUS | Status: DC | PRN
  Administered 2015-08-30: 180 mg via INTRAVENOUS

## 2015-08-30 MED ORDER — PHENYLEPHRINE HCL 10 MG/ML IJ SOLN
INTRAMUSCULAR | Status: DC | PRN
  Administered 2015-08-30: .06 mg via INTRAVENOUS

## 2015-08-30 MED ORDER — PROPOFOL 10 MG/ML IV BOLUS
INTRAVENOUS | Status: AC
  Filled 2015-08-30: qty 20

## 2015-08-30 MED ORDER — SIMETHICONE 80 MG PO CHEW: 80.0000 mg | CHEWABLE_TABLET | Freq: Four times a day (QID) | ORAL | Status: DC | PRN

## 2015-08-30 MED ORDER — LOSARTAN POTASSIUM-HCTZ 50-12.5 MG PO TABS
0.5000 | ORAL_TABLET | Freq: Every day | ORAL | Status: DC
  Filled 2015-08-30 (×2): qty 0.5

## 2015-08-30 MED ORDER — PANTOPRAZOLE SODIUM 40 MG PO TBEC
40.0000 mg | DELAYED_RELEASE_TABLET | Freq: Every day | ORAL | Status: DC
Start: 2015-08-31 — End: 2015-08-31
  Administered 2015-08-31: 40 mg via ORAL
  Filled 2015-08-30: qty 1

## 2015-08-30 MED ORDER — LOSARTAN POTASSIUM-HCTZ 50-12.5 MG PO TABS: 0.5000 | ORAL_TABLET | Freq: Every day | ORAL | Status: DC

## 2015-08-30 MED ORDER — DEXAMETHASONE SODIUM PHOSPHATE 4 MG/ML IJ SOLN
INTRAMUSCULAR | Status: AC
  Filled 2015-08-30: qty 1

## 2015-08-30 MED ORDER — EPHEDRINE SULFATE 50 MG/ML IJ SOLN
INTRAMUSCULAR | Status: DC | PRN
Start: 2015-08-30 — End: 2015-08-30
  Administered 2015-08-30: 10 mg via INTRAVENOUS

## 2015-08-30 MED ORDER — MIDAZOLAM HCL 2 MG/2ML IJ SOLN
INTRAMUSCULAR | Status: DC | PRN
  Administered 2015-08-30: 2 mg via INTRAVENOUS

## 2015-08-30 MED ORDER — ROCURONIUM BROMIDE 100 MG/10ML IV SOLN
INTRAVENOUS | Status: DC | PRN
  Administered 2015-08-30: 50 mg via INTRAVENOUS

## 2015-08-30 MED ORDER — BUPIVACAINE HCL (PF) 0.25 % IJ SOLN
INTRAMUSCULAR | Status: DC | PRN
  Administered 2015-08-30 (×2): 10 mL

## 2015-08-30 MED ORDER — PHENYLEPHRINE 40 MCG/ML (10ML) SYRINGE FOR IV PUSH (FOR BLOOD PRESSURE SUPPORT)
PREFILLED_SYRINGE | INTRAVENOUS | Status: AC
  Filled 2015-08-30: qty 10

## 2015-08-30 SURGICAL SUPPLY — 49 items
APPLICATOR COTTON TIP 6IN STRL (MISCELLANEOUS) ×3 IMPLANT
CATH ROBINSON RED A/P 16FR (CATHETERS) IMPLANT
CLOTH BEACON ORANGE TIMEOUT ST (SAFETY) ×3 IMPLANT
CONT PATH 16OZ SNAP LID 3702 (MISCELLANEOUS) ×3 IMPLANT
COVER BACK TABLE 60X90IN (DRAPES) ×3 IMPLANT
DECANTER SPIKE VIAL GLASS SM (MISCELLANEOUS) ×18 IMPLANT
DEFOGGER SCOPE WARMER CLEARIFY (MISCELLANEOUS) ×3 IMPLANT
DILATOR CANAL MILEX (MISCELLANEOUS) ×3 IMPLANT
DRAPE STERI URO 9X17 APER PCH (DRAPES) ×3 IMPLANT
DRSG COVADERM PLUS 2X2 (GAUZE/BANDAGES/DRESSINGS) ×6 IMPLANT
DRSG OPSITE POSTOP 3X4 (GAUZE/BANDAGES/DRESSINGS) IMPLANT
DURAPREP 26ML APPLICATOR (WOUND CARE) ×3 IMPLANT
ELECT LIGASURE LONG (ELECTRODE) IMPLANT
ELECT LIGASURE SHORT 9 REUSE (ELECTRODE) ×3 IMPLANT
ELECT REM PT RETURN 9FT ADLT (ELECTROSURGICAL) ×3
ELECTRODE REM PT RTRN 9FT ADLT (ELECTROSURGICAL) ×2 IMPLANT
GAUZE PACKING 1/2X5YD (GAUZE/BANDAGES/DRESSINGS) ×3 IMPLANT
GLOVE BIOGEL M 6.5 STRL (GLOVE) ×6 IMPLANT
GLOVE BIOGEL PI IND STRL 6.5 (GLOVE) ×4 IMPLANT
GLOVE BIOGEL PI IND STRL 7.0 (GLOVE) ×6 IMPLANT
GLOVE BIOGEL PI INDICATOR 6.5 (GLOVE) ×2
GLOVE BIOGEL PI INDICATOR 7.0 (GLOVE) ×3
LEGGING LITHOTOMY PAIR STRL (DRAPES) ×3 IMPLANT
LIQUID BAND (GAUZE/BANDAGES/DRESSINGS) ×3 IMPLANT
NEEDLE SPNL 22GX3.5 QUINCKE BK (NEEDLE) ×3 IMPLANT
NS IRRIG 1000ML POUR BTL (IV SOLUTION) ×3 IMPLANT
PACK LAVH (CUSTOM PROCEDURE TRAY) ×3 IMPLANT
PACK ROBOTIC GOWN (GOWN DISPOSABLE) ×3 IMPLANT
PAD TRENDELENBURG POSITION (MISCELLANEOUS) ×3 IMPLANT
SEALER TISSUE G2 CVD JAW 35 (ENDOMECHANICALS) IMPLANT
SEALER TISSUE G2 CVD JAW 45CM (ENDOMECHANICALS)
SET IRRIG TUBING LAPAROSCOPIC (IRRIGATION / IRRIGATOR) ×3 IMPLANT
SHEARS HARMONIC ACE PLUS 36CM (ENDOMECHANICALS) ×3 IMPLANT
SLEEVE XCEL OPT CAN 5 100 (ENDOMECHANICALS) ×6 IMPLANT
SOLUTION ELECTROLUBE (MISCELLANEOUS) IMPLANT
STRIP CLOSURE SKIN 1/4X3 (GAUZE/BANDAGES/DRESSINGS) IMPLANT
SUT VIC AB 0 CT1 27 (SUTURE) ×2
SUT VIC AB 0 CT1 27XCR 8 STRN (SUTURE) ×4 IMPLANT
SUT VIC AB 0 CT1 36 (SUTURE) ×6 IMPLANT
SUT VIC AB 0 CTXB 36 (SUTURE) IMPLANT
SUT VICRYL 1 TIES 12X18 (SUTURE) ×3 IMPLANT
SUT VICRYL 4-0 PS2 18IN ABS (SUTURE) ×3 IMPLANT
SYR CONTROL 10ML LL (SYRINGE) ×3 IMPLANT
TOWEL OR 17X24 6PK STRL BLUE (TOWEL DISPOSABLE) ×6 IMPLANT
TRAY FOLEY CATH SILVER 14FR (SET/KITS/TRAYS/PACK) ×3 IMPLANT
TROCAR OPTI TIP 5M 100M (ENDOMECHANICALS) ×3 IMPLANT
TROCAR XCEL NON-BLD 11X100MML (ENDOMECHANICALS) IMPLANT
WARMER LAPAROSCOPE (MISCELLANEOUS) ×3 IMPLANT
WATER STERILE IRR 1000ML POUR (IV SOLUTION) ×3 IMPLANT

## 2015-08-30 NOTE — H&P (Signed)
Date of Initial H&P: 08/29/2015 History reviewed, patient examined, no change in status, stable for surgery.

## 2015-08-30 NOTE — Anesthesia Postprocedure Evaluation (Signed)
Anesthesia Post Note  Patient: Christy Griffin  Procedure(s) Performed: Procedure(s) (LRB): LAPAROSCOPIC ASSISTED VAGINAL HYSTERECTOMY  (N/A) WITH BILATERAL SALPINGO OOPHORECTOMY (Bilateral)  Patient location during evaluation: PACU Anesthesia Type: General Level of consciousness: awake and alert Pain management: pain level controlled Vital Signs Assessment: post-procedure vital signs reviewed and stable Respiratory status: spontaneous breathing, nonlabored ventilation, respiratory function stable and patient connected to nasal cannula oxygen Cardiovascular status: blood pressure returned to baseline and stable Postop Assessment: no signs of nausea or vomiting Anesthetic complications: no    Last Vitals:  Filed Vitals:   08/30/15 1515 08/30/15 1530  BP: 113/67 112/69  Pulse: 85 80  Temp: 36.8 C   Resp: 17 16    Last Pain: There were no vitals filed for this visit.               Serrena Linderman

## 2015-08-30 NOTE — Anesthesia Preprocedure Evaluation (Signed)
Anesthesia Evaluation  Patient identified by MRN, date of birth, ID band Patient awake    Reviewed: Allergy & Precautions, NPO status , Patient's Chart, lab work & pertinent test results  Airway Mallampati: II   Neck ROM: full    Dental   Pulmonary neg pulmonary ROS,    breath sounds clear to auscultation       Cardiovascular + angina  Rhythm:regular Rate:Normal     Neuro/Psych  Headaches,    GI/Hepatic   Endo/Other    Renal/GU      Musculoskeletal   Abdominal   Peds  Hematology   Anesthesia Other Findings   Reproductive/Obstetrics                             Anesthesia Physical Anesthesia Plan  ASA: II  Anesthesia Plan: General   Post-op Pain Management:    Induction: Intravenous  Airway Management Planned: Oral ETT  Additional Equipment:   Intra-op Plan:   Post-operative Plan: Extubation in OR  Informed Consent: I have reviewed the patients History and Physical, chart, labs and discussed the procedure including the risks, benefits and alternatives for the proposed anesthesia with the patient or authorized representative who has indicated his/her understanding and acceptance.     Plan Discussed with: CRNA, Anesthesiologist and Surgeon  Anesthesia Plan Comments:         Anesthesia Quick Evaluation

## 2015-08-30 NOTE — Op Note (Signed)
08/30/2015  3:33 PM  PATIENT:  Christy Griffin  55 y.o. female  PRE-OPERATIVE DIAGNOSIS:  Fibroids Complex Cyst Right Ovary  POST-OPERATIVE DIAGNOSIS:  fibroids, complex cyst right ovary  PROCEDURE:  Procedure(s): LAPAROSCOPIC ASSISTED VAGINAL HYSTERECTOMY  (N/A) WITH BILATERAL SALPINGO OOPHORECTOMY (Bilateral)  SURGEON:  Surgeon(s) and Role:    * Christophe Louis, MD - Primary    * Janyth Pupa, DO - Assisting  PHYSICIAN ASSISTANT: None`   ASSISTANTS: Dr. Janyth Pupa was needed to assist due to the complexity of the surgery    ANESTHESIA:   general  EBL:  Total I/O In: 2000 [I.V.:2000] Out: 150 [Urine:100; Blood:50]  BLOOD ADMINISTERED:none  DRAINS: Urinary Catheter (Foley)   LOCAL MEDICATIONS USED:  MARCAINE     SPECIMEN:  Source of Specimen:  uterus , cervix  and bilateral fallopian tubes and ovaries   DISPOSITION OF SPECIMEN:  PATHOLOGY  COUNTS:  YES  TOURNIQUET:  * No tourniquets in log *  DICTATION: .Dragon Dictation  PLAN OF CARE:  Admit for observation   PATIENT DISPOSITION:  PACU - hemodynamically stable.   Delay start of Pharmacological VTE agent (>24hrs) due to surgical blood loss or risk of bleeding: not applicable  Procedure: the patient was taken to the operating room placed under general anesthesia. Prepped and draped in the normal sterile fashion. A foley catheter was placed. A uterine manipulator was placed. Attention was turned to the abdomen where the umbilicus was injected with 10 cc of marcaine. A 5 mm trocar was placed under direct visualization. Pneumoperitoneum was achieved with C02 gas... A 5 mm trocar was placed in the right and left lower quadrants. Each trocar site was injected with 10 cc of marcaine prior to trocar placement.  . The left ureter was identified. The left infundibulopelvic ligament was cauterized and transected with the harmonic scalpel. Followed by the broad ligament and the round ligament. This was repeated on the right side.   Attention was then turned to the vagina. A weighted speculum was placed in to the vagina and the cervix was grasped with a toothed tenaculum. The cervix was then injected circumferentially with vasopressin.  The cervix was then circumferentially incised with the bovie and the bladder was dissected off the pubovesical cervical fascia. The anterior cul-de-sac as entered sharply. The same procedure was performed posteriorly and the posterior cu-lde-sac was entered sharply without difficulty. A Heaney clamp was placed over the uterosacral ligaments bilaterally., These were transected and suture ligated with 0 vicryl. The cardinal ligaments were then grasped with the ligasure cauterized and transected.  The uterine arteries Then clamped with ligasure cauterized and , transected .Excellent hemostasis was visualized. The uterus cervix bilateral fallopian tubes and ovaries were then removed.  A 0 vicryl suture was then used to performed a modified mc Calls culdoplasty. The vaginal cuff angles were closed with an angle suture of 0 vicryl and transfixed to the ipsilateral uterosacral ligaments. The remainder of the vaginal cuff was closed with 0 vicryl in a running locked fashion. All instruments were removed from the vagina. Attention was turned to the abdomen were pneumoperitoneum was reestablished. The pelvis was irrigated. Excellent hemostasis was noted. All trocars were removed under direct visualization . The pneumoperitoneum was released. The skin incisions were closed with 4-0 vicryl and dermabond.  The patient was taken to the recovery room awake and in stable condition.  Sponge lap and needle counts were correct times 2.

## 2015-08-30 NOTE — Anesthesia Procedure Notes (Signed)
Procedure Name: Intubation Date/Time: 08/30/2015 12:48 PM Performed by: Bufford Spikes Pre-anesthesia Checklist: Patient identified, Timeout performed, Emergency Drugs available, Suction available and Patient being monitored Patient Re-evaluated:Patient Re-evaluated prior to inductionOxygen Delivery Method: Circle system utilized Preoxygenation: Pre-oxygenation with 100% oxygen Intubation Type: IV induction Ventilation: Mask ventilation with difficulty Laryngoscope Size: Miller and 2 Grade View: Grade I Tube type: Oral Tube size: 7.0 mm Number of attempts: 1 Airway Equipment and Method: Stylet Placement Confirmation: ETT inserted through vocal cords under direct vision,  positive ETCO2 and breath sounds checked- equal and bilateral Secured at: 22 cm Tube secured with: Tape Dental Injury: Teeth and Oropharynx as per pre-operative assessment

## 2015-08-30 NOTE — Transfer of Care (Signed)
Immediate Anesthesia Transfer of Care Note  Patient: Christy Griffin  Procedure(s) Performed: Procedure(s): LAPAROSCOPIC ASSISTED VAGINAL HYSTERECTOMY  (N/A) WITH BILATERAL SALPINGO OOPHORECTOMY (Bilateral)  Patient Location: PACU  Anesthesia Type:General  Level of Consciousness: awake  Airway & Oxygen Therapy: Patient Spontanous Breathing  Post-op Assessment: Report given to PACU RN  Post vital signs: stable  There were no vitals filed for this visit.  Complications: No apparent anesthesia complications

## 2015-08-31 ENCOUNTER — Encounter (HOSPITAL_COMMUNITY): Payer: Self-pay | Admitting: Obstetrics and Gynecology

## 2015-08-31 DIAGNOSIS — N840 Polyp of corpus uteri: Secondary | ICD-10-CM | POA: Diagnosis not present

## 2015-08-31 LAB — CBC
HEMATOCRIT: 28 % — AB (ref 36.0–46.0)
HEMOGLOBIN: 9.6 g/dL — AB (ref 12.0–15.0)
MCH: 32.3 pg (ref 26.0–34.0)
MCHC: 34.3 g/dL (ref 30.0–36.0)
MCV: 94.3 fL (ref 78.0–100.0)
Platelets: 182 10*3/uL (ref 150–400)
RBC: 2.97 MIL/uL — ABNORMAL LOW (ref 3.87–5.11)
RDW: 12 % (ref 11.5–15.5)
WBC: 7.3 10*3/uL (ref 4.0–10.5)

## 2015-08-31 MED ORDER — OXYCODONE-ACETAMINOPHEN 5-325 MG PO TABS: 1.0000 | ORAL_TABLET | Freq: Four times a day (QID) | ORAL | Status: DC | PRN

## 2015-08-31 MED ORDER — IBUPROFEN 800 MG PO TABS: 800.0000 mg | ORAL_TABLET | Freq: Three times a day (TID) | ORAL | Status: AC | PRN

## 2015-08-31 NOTE — Anesthesia Postprocedure Evaluation (Signed)
Anesthesia Post Note  Patient: Christy Griffin  Procedure(s) Performed: Procedure(s) (LRB): LAPAROSCOPIC ASSISTED VAGINAL HYSTERECTOMY  (N/A) WITH BILATERAL SALPINGO OOPHORECTOMY (Bilateral)  Patient location during evaluation: [facility] Anesthesia Type: General Level of consciousness: awake and alert Pain management: pain level controlled Vital Signs Assessment: post-procedure vital signs reviewed and stable Respiratory status: spontaneous breathing and nonlabored ventilation Cardiovascular status: stable Postop Assessment: no headache, no backache, no signs of nausea or vomiting and adequate PO intake Anesthetic complications: no    Last Vitals:  Filed Vitals:   08/31/15 0104 08/31/15 0514  BP: 121/74 116/62  Pulse: 102 94  Temp: 36.4 C 37.3 C  Resp: 16 16    Last Pain:  Filed Vitals:   08/31/15 0757  PainSc: 3                  Janis Cuffe

## 2015-08-31 NOTE — Addendum Note (Signed)
Addendum  created 08/31/15 1015 by Jonna Munro, CRNA   Modules edited: Clinical Notes   Clinical Notes:  File: [id number]

## 2015-08-31 NOTE — Discharge Summary (Signed)
Physician Discharge Summary  Patient ID: Christy Griffin MRN: [id number] DOB/AGE: Sep 08, 1960 55 y.o.  Admit date: 08/30/2015 Discharge date: 08/31/2015  Admission Diagnoses: 1. Fibroids 2. Right ovarian cyst   Discharge Diagnoses:  Principal Problem:   Fibroids, intramural Active Problems:   Right ovarian cyst   Essential hypertension   S/P laparoscopic assisted vaginal hysterectomy (LAVH)   Discharged Condition: good  Hospital Course:  Pt was admited for observation after undergoing a laparoscopic assisted vaginal hysterectomy with bilateral salpingoophorectomy 08/30/2015.  She did well postoperatively with return of bowel and bladder function.   Consults: None  Significant Diagnostic Studies: labs: HGB pod #1 9.3  Treatments: surgery: LAVH/ BSO  Discharge Exam: Blood pressure 116/62, pulse 94, temperature 99.1 F (37.3 C), temperature source Oral, resp. rate 16, height 5\' 3"  (1.6 m), weight 143 lb (64.864 kg), SpO2 94 %. General A&O NAD CV RRR  Lungs Clear  Abdomen soft nontender nondistended +BS  Ext No Edema   Disposition: 01-Home or Self Care  Discharge Instructions    Call MD for:  persistant nausea and vomiting    Complete by:  As directed      Call MD for:  redness, tenderness, or signs of infection (pain, swelling, redness, odor or green/yellow discharge around incision site)    Complete by:  As directed      Call MD for:  severe uncontrolled pain    Complete by:  As directed      Call MD for:  temperature >100.4    Complete by:  As directed      Diet - low sodium heart healthy    Complete by:  As directed      Driving Restrictions    Complete by:  As directed   Avoid driving for 1 week     Increase activity slowly    Complete by:  As directed      Lifting restrictions    Complete by:  As directed   Avoid lifting over 10 lbs     Sexual Activity Restrictions    Complete by:  As directed   Avoid sexual activity            Medication List    TAKE  these medications        BIOTIN PO  Take 1 tablet by mouth daily.     famotidine 20 MG tablet  Commonly known as:  PEPCID  Take 1 tablet (20 mg total) by mouth 2 (two) times daily.     ibuprofen 800 MG tablet  Commonly known as:  ADVIL,MOTRIN  Take 1 tablet (800 mg total) by mouth every 8 (eight) hours as needed (mild pain).     losartan-hydrochlorothiazide 50-12.5 MG tablet  Commonly known as:  HYZAAR  Take 0.5 tablets by mouth daily.     oxyCODONE-acetaminophen 5-325 MG tablet  Commonly known as:  PERCOCET/ROXICET  Take 1-2 tablets by mouth every 6 (six) hours as needed (moderate to severe pain (when tolerating fluids)).     pseudoephedrine 30 MG tablet  Commonly known as:  SUDAFED  Take 30 mg by mouth every 4 (four) hours as needed for congestion.           Follow-up Information    Follow up with Catha Brow., MD. Schedule an appointment as soon as possible for a visit in 2 weeks.   Specialty:  Obstetrics and Gynecology   Why:  pt may already have an appointment   Contact information:   [place].  [address] [address] [phone]       Signed: Catha Brow. 08/31/2015, 9:40 AM

## 2015-08-31 NOTE — Progress Notes (Signed)
Pt teaching complete  Out in wheelchair   

## 2015-08-31 NOTE — Progress Notes (Signed)
Vaginal packing removed as ordered, small amount bloody drainage noted on packing.  Foley cath removed as ordered, patient tolerated well.

## 2016-04-15 ENCOUNTER — Telehealth: Payer: Self-pay | Admitting: Hematology

## 2016-04-15 ENCOUNTER — Encounter: Payer: Self-pay | Admitting: Hematology

## 2016-04-15 NOTE — Telephone Encounter (Signed)
Pt confirmed appt, completed intake, mailed new pt letter, faxed referring provider appt date/time

## 2016-05-06 ENCOUNTER — Other Ambulatory Visit: Payer: Self-pay | Admitting: Family Medicine

## 2016-05-06 DIAGNOSIS — Z1231 Encounter for screening mammogram for malignant neoplasm of breast: Secondary | ICD-10-CM

## 2016-05-16 ENCOUNTER — Ambulatory Visit (HOSPITAL_BASED_OUTPATIENT_CLINIC_OR_DEPARTMENT_OTHER): Payer: Federal, State, Local not specified - PPO

## 2016-05-16 ENCOUNTER — Ambulatory Visit (HOSPITAL_BASED_OUTPATIENT_CLINIC_OR_DEPARTMENT_OTHER): Payer: Federal, State, Local not specified - PPO | Admitting: Hematology

## 2016-05-16 ENCOUNTER — Telehealth: Payer: Self-pay | Admitting: Hematology

## 2016-05-16 VITALS — BP 109/67 | HR 86 | Temp 98.7°F | Resp 20 | Ht 63.0 in | Wt 134.8 lb

## 2016-05-16 DIAGNOSIS — D72819 Decreased white blood cell count, unspecified: Secondary | ICD-10-CM | POA: Diagnosis not present

## 2016-05-16 DIAGNOSIS — D709 Neutropenia, unspecified: Secondary | ICD-10-CM

## 2016-05-16 LAB — CBC & DIFF AND RETIC
BASO%: 0.6 % (ref 0.0–2.0)
Basophils Absolute: 0 10*3/uL (ref 0.0–0.1)
EOS%: 0.6 % (ref 0.0–7.0)
Eosinophils Absolute: 0 10*3/uL (ref 0.0–0.5)
HCT: 37.1 % (ref 34.8–46.6)
HGB: 12.6 g/dL (ref 11.6–15.9)
Immature Retic Fract: 9.8 % (ref 1.60–10.00)
LYMPH%: 48.9 % (ref 14.0–49.7)
MCH: 32.1 pg (ref 25.1–34.0)
MCHC: 34 g/dL (ref 31.5–36.0)
MCV: 94.4 fL (ref 79.5–101.0)
MONO#: 0.5 10*3/uL (ref 0.1–0.9)
MONO%: 13.5 % (ref 0.0–14.0)
NEUT%: 36.4 % — ABNORMAL LOW (ref 38.4–76.8)
NEUTROS ABS: 1.2 10*3/uL — AB (ref 1.5–6.5)
Platelets: 224 10*3/uL (ref 145–400)
RBC: 3.93 10*6/uL (ref 3.70–5.45)
RDW: 11.5 % (ref 11.2–14.5)
Retic %: 1.18 % (ref 0.70–2.10)
Retic Ct Abs: 46.37 10*3/uL (ref 33.70–90.70)
WBC: 3.3 10*3/uL — AB (ref 3.9–10.3)
lymph#: 1.6 10*3/uL (ref 0.9–3.3)

## 2016-05-16 LAB — COMPREHENSIVE METABOLIC PANEL
ALT: 14 U/L (ref 0–55)
AST: 19 U/L (ref 5–34)
Albumin: 4.1 g/dL (ref 3.5–5.0)
Alkaline Phosphatase: 103 U/L (ref 40–150)
Anion Gap: 11 mEq/L (ref 3–11)
BILIRUBIN TOTAL: 0.44 mg/dL (ref 0.20–1.20)
BUN: 16 mg/dL (ref 7.0–26.0)
CO2: 25 meq/L (ref 22–29)
Calcium: 10.2 mg/dL (ref 8.4–10.4)
Chloride: 106 mEq/L (ref 98–109)
Creatinine: 0.8 mg/dL (ref 0.6–1.1)
GLUCOSE: 87 mg/dL (ref 70–140)
POTASSIUM: 3.9 meq/L (ref 3.5–5.1)
SODIUM: 142 meq/L (ref 136–145)
TOTAL PROTEIN: 9.2 g/dL — AB (ref 6.4–8.3)

## 2016-05-16 LAB — LACTATE DEHYDROGENASE: LDH: 183 U/L (ref 125–245)

## 2016-05-16 LAB — CHCC SMEAR

## 2016-05-16 NOTE — Progress Notes (Signed)
Marland Kitchen    HEMATOLOGY/ONCOLOGY CONSULTATION NOTE  Date of Service: 05/16/2016  Patient Care Team: Harlan Stains, MD as PCP - General (Family Medicine)  CHIEF COMPLAINTS/PURPOSE OF CONSULTATION:  Leucopenia   HISTORY OF PRESENTING ILLNESS:   Christy Griffin is a wonderful 55 y.o. female who has been referred to [place] by Dr .Harlan Stains, MD for evaluation and management of leucopenia.  Patient had routine labs with her PCP on 02/12/2017 which showed a CBC with WBC count of 2.8k with ANC of 1k , nl hgb of 12 with MCV of 96 and nl PLT of 253k.  Review of available labs suggest that since 2014 her WBC count has varied from 2.8-3.8k with [place] 1-1.5k. Patient denies issues with freq/severe infections. Denies frequent infections as a children.  No fevers/chills/night sweats/weight loss or other concerning symptoms.  TSH WNL. Used OTC inuprofen prn for headaches. No overt recent viral infection.   MEDICAL HISTORY:  Past Medical History:  Diagnosis Date  . Anginal pain ([place])   . Headache   seasonal allergies HTN Migraine headaches Tinea Versicolor Yeast infection   SURGICAL HISTORY: Past Surgical History:  Procedure Laterality Date  . LAPAROSCOPIC VAGINAL HYSTERECTOMY WITH SALPINGO OOPHORECTOMY N/A 08/30/2015   Procedure: LAPAROSCOPIC ASSISTED VAGINAL HYSTERECTOMY ;  Surgeon: Christophe Louis, MD;  Location: [facility];  Service: Gynecology;  Laterality: N/A;  . SALPINGOOPHORECTOMY Bilateral 08/30/2015   Procedure: WITH BILATERAL SALPINGO OOPHORECTOMY;  Surgeon: Christophe Louis, MD;  Location: [facility];  Service: Gynecology;  Laterality: Bilateral;  . TUBAL LIGATION      SOCIAL HISTORY: Social History   Social History  . Marital status: Married    Spouse name: N/A  . Number of children: N/A  . Years of education: N/A   Occupational History  . Not on file.   Social History Main Topics  . Smoking status: Never Smoker  . Smokeless tobacco: Not on file  . Alcohol use No  . Drug use: No  .  Sexual activity: Not on file   Other Topics Concern  . Not on file   Social History Narrative  . No narrative on file    FAMILY HISTORY: No family history on file.  ALLERGIES:  is allergic to rubbing alcohol [alcohol].  MEDICATIONS:  Current Outpatient Prescriptions  Medication Sig Dispense Refill  . BIOTIN PO Take 1 tablet by mouth daily.    . famotidine (PEPCID) 20 MG tablet Take 1 tablet (20 mg total) by mouth 2 (two) times daily. (Patient not taking: Reported on 08/10/2015) 30 tablet 0  . ibuprofen (ADVIL,MOTRIN) 800 MG tablet Take 1 tablet (800 mg total) by mouth every 8 (eight) hours as needed (mild pain). 30 tablet 1  . losartan-hydrochlorothiazide (HYZAAR) 50-12.5 MG tablet Take 0.5 tablets by mouth daily.    Marland Kitchen oxyCODONE-acetaminophen (PERCOCET/ROXICET) 5-325 MG tablet Take 1-2 tablets by mouth every 6 (six) hours as needed (moderate to severe pain (when tolerating fluids)). 30 tablet 0  . pseudoephedrine (SUDAFED) 30 MG tablet Take 30 mg by mouth every 4 (four) hours as needed for congestion.     No current facility-administered medications for this visit.     REVIEW OF SYSTEMS:    10 Point review of Systems was done is negative except as noted above.  PHYSICAL EXAMINATION: ECOG PERFORMANCE STATUS: 1 - Symptomatic but completely ambulatory  . Vitals:   05/16/16 1358  BP: 109/67  Pulse: 86  Resp: 20  Temp: 98.7 F (37.1 C)   Filed Weights   05/16/16  1358  Weight: 134 lb 12.8 oz (61.1 kg)   .Body mass index is 23.88 kg/m.  GENERAL:alert, in no acute distress and comfortable SKIN: skin color, texture, turgor are normal, no rashes or significant lesions EYES: normal, conjunctiva are pink and non-injected, sclera clear OROPHARYNX:no exudate, no erythema and lips, buccal mucosa, and tongue normal  NECK: supple, no JVD, thyroid normal size, non-tender, without nodularity LYMPH:  no palpable lymphadenopathy in the cervical, axillary or inguinal LUNGS: clear to  auscultation with normal respiratory effort HEART: regular rate & rhythm,  no murmurs and no lower extremity edema ABDOMEN: abdomen soft, non-tender, normoactive bowel sounds , no palpable hepato-splenoemgaly. Musculoskeletal: no cyanosis of digits and no clubbing  PSYCH: alert & oriented x 3 with fluent speech NEURO: no focal motor/sensory deficits  LABORATORY DATA:  I have reviewed the data as listed  . CBC Latest Ref Rng & Units 05/16/2016 08/31/2015 08/26/2006  WBC 3.9 - 10.3 10e3/uL 3.3(L) 7.3 3.5(L)  Hemoglobin 11.6 - 15.9 g/dL 12.6 9.6(L) 11.2(L)  Hematocrit 34.8 - 46.6 % 37.1 28.0(L) 33.2(L)  Platelets 145 - 400 10e3/uL 224 182 290   ANC 1.2k  CBC    Component Value Date/Time   WBC 3.3 (L) 05/16/2016 1545   WBC 7.3 08/31/2015 0545   RBC 3.93 05/16/2016 1545   RBC 2.97 (L) 08/31/2015 0545   HGB 12.6 05/16/2016 1545   HCT 37.1 05/16/2016 1545   PLT 224 05/16/2016 1545   MCV 94.4 05/16/2016 1545   MCH 32.1 05/16/2016 1545   MCH 32.3 08/31/2015 0545   MCHC 34.0 05/16/2016 1545   MCHC 34.3 08/31/2015 0545   RDW 11.5 05/16/2016 1545   LYMPHSABS 1.6 05/16/2016 1545   MONOABS 0.5 05/16/2016 1545   EOSABS 0.0 05/16/2016 1545   BASOSABS 0.0 05/16/2016 1545    . CMP Latest Ref Rng & Units 05/16/2016 05/08/2006  Glucose 70 - 140 mg/dl 87 110(H)  BUN 7.0 - 26.0 mg/dL 16.0 12  Creatinine 0.6 - 1.1 mg/dL 0.8 0.78  Sodium 136 - 145 mEq/L 142 138  Potassium 3.5 - 5.1 mEq/L 3.9 3.3(L)  Chloride 96 - 112 mEq/L - 101  CO2 22 - 29 mEq/L 25 27  Calcium 8.4 - 10.4 mg/dL 10.2 9.4  Total Protein 6.4 - 8.3 g/dL 9.2(H) 8.3  Total Bilirubin 0.20 - 1.20 mg/dL 0.44 0.3  Alkaline Phos 40 - 150 U/L 103 63  AST 5 - 34 U/L 19 18  ALT 0 - 55 U/L 14 9   Component     Latest Ref Rng & Units 05/16/2016  LDH     125 - 245 U/L 183  TSH     0.308 - 3.960 m(IU)/L 0.848  T4,Free(Direct)     0.82 - 1.77 ng/dL 1.11  Vitamin B12     211 - 946 pg/mL 279    RADIOGRAPHIC STUDIES: I have  personally reviewed the radiological images as listed and agreed with the findings in the report. No results found.  ASSESSMENT & PLAN:   55 yo with   1) Mild Leucopenia with mild neutropenia No significant infections No clinical evidence of lymphoproliferative process LDH wnl B12 low normal  It is most likely related to Benign Ethinic Neutropenia No associated anemia or thrombocytopenia to suggest overt BM pathology. Smear with no increased blasts. No overt dysplastic changes.  PLan -would replace the B12 with 1058mcg SL po daily -no indication for BM Bx as this time. -avoid OTC NSAIDS or other medications that can cause  low WBC counts/neutropenia. -monitor WBC/ANC q58mhts with PCP and reconsult if <=700 ANC.  RTC with Dr Irene Limbo on an as needed basis   All of the patients questions were answered with apparent satisfaction. The patient knows to call the clinic with any problems, questions or concerns.  I spent 35 minutes counseling the patient face to face. The total time spent in the appointment was 45 minutes and more than 50% was on counseling and direct patient cares.    Sullivan Lone MD [place] AAHIVMS [facility] Hematology/Oncology Physician [facility]  (Office):       [phone] (Work cell):  [phone] (Fax):           [phone]  05/16/2016 3:00 PM

## 2016-05-16 NOTE — Progress Notes (Deleted)
Marland Kitchen    HEMATOLOGY/ONCOLOGY CONSULTATION NOTE  Date of Service: 05/16/2016  Patient Care Team: Harlan Stains, MD as PCP - General (Family Medicine)  CHIEF COMPLAINTS/PURPOSE OF CONSULTATION:  leucopenia  HISTORY OF PRESENTING ILLNESS:  Christy Griffin is a wonderful 55 y.o. female who has been referred to [place] by Dr Merryl Hacker for evaluation and management of ***  MEDICAL HISTORY:  Past Medical History:  Diagnosis Date  . Anginal pain ([place])   . Headache     SURGICAL HISTORY: Past Surgical History:  Procedure Laterality Date  . LAPAROSCOPIC VAGINAL HYSTERECTOMY WITH SALPINGO OOPHORECTOMY N/A 08/30/2015   Procedure: LAPAROSCOPIC ASSISTED VAGINAL HYSTERECTOMY ;  Surgeon: Christophe Louis, MD;  Location: [facility];  Service: Gynecology;  Laterality: N/A;  . SALPINGOOPHORECTOMY Bilateral 08/30/2015   Procedure: WITH BILATERAL SALPINGO OOPHORECTOMY;  Surgeon: Christophe Louis, MD;  Location: [facility] [facility];  Service: Gynecology;  Laterality: Bilateral;  . TUBAL LIGATION      SOCIAL HISTORY: Social History   Social History  . Marital status: Married    Spouse name: N/A  . Number of children: N/A  . Years of education: N/A   Occupational History  . Not on file.   Social History Main Topics  . Smoking status: Never Smoker  . Smokeless tobacco: Not on file  . Alcohol use No  . Drug use: No  . Sexual activity: Not on file   Other Topics Concern  . Not on file   Social History Narrative  . No narrative on file    FAMILY HISTORY: No family history on file.  ALLERGIES:  is allergic to rubbing alcohol [alcohol].  MEDICATIONS:  Current Outpatient Prescriptions  Medication Sig Dispense Refill  . BIOTIN PO Take 1 tablet by mouth daily.    . famotidine (PEPCID) 20 MG tablet Take 1 tablet (20 mg total) by mouth 2 (two) times daily. (Patient not taking: Reported on 08/10/2015) 30 tablet 0  . ibuprofen (ADVIL,MOTRIN) 800 MG tablet Take 1 tablet (800 mg total) by mouth every 8 (eight) hours as needed (mild  pain). 30 tablet 1  . losartan-hydrochlorothiazide (HYZAAR) 50-12.5 MG tablet Take 0.5 tablets by mouth daily.    Marland Kitchen oxyCODONE-acetaminophen (PERCOCET/ROXICET) 5-325 MG tablet Take 1-2 tablets by mouth every 6 (six) hours as needed (moderate to severe pain (when tolerating fluids)). 30 tablet 0  . pseudoephedrine (SUDAFED) 30 MG tablet Take 30 mg by mouth every 4 (four) hours as needed for congestion.     No current facility-administered medications for this visit.     REVIEW OF SYSTEMS:    10 Point review of Systems was done is negative except as noted above.  PHYSICAL EXAMINATION: ECOG PERFORMANCE STATUS: {CHL ONC ECOG [id number]  . Vitals:   05/16/16 1358  BP: 109/67  Pulse: 86  Resp: 20  Temp: 98.7 F (37.1 C)   Filed Weights   05/16/16 1358  Weight: 134 lb 12.8 oz (61.1 kg)   .Body mass index is 23.88 kg/m.  GENERAL:alert, in no acute distress and comfortable SKIN: skin color, texture, turgor are normal, no rashes or significant lesions EYES: normal, conjunctiva are pink and non-injected, sclera clear OROPHARYNX:no exudate, no erythema and lips, buccal mucosa, and tongue normal  NECK: supple, no JVD, thyroid normal size, non-tender, without nodularity LYMPH:  no palpable lymphadenopathy in the cervical, axillary or inguinal LUNGS: clear to auscultation with normal respiratory effort HEART: regular rate & rhythm,  no murmurs and no lower extremity edema ABDOMEN: abdomen soft, non-tender, normoactive bowel sounds  Musculoskeletal: no cyanosis of digits and no clubbing  PSYCH: alert & oriented x 3 with fluent speech NEURO: no focal motor/sensory deficits  LABORATORY DATA:  I have reviewed the data as listed  . CBC Latest Ref Rng & Units 08/31/2015 08/26/2006 05/08/2006  WBC 4.0 - 10.5 K/uL 7.3 3.5(L) 2.9(L)  Hemoglobin 12.0 - 15.0 g/dL 9.6(L) 11.2(L) 9.9(L)  Hematocrit 36.0 - 46.0 % 28.0(L) 33.2(L) 28.9(L)  Platelets 150 - 400 K/uL 182 290 283   . CBC      Component Value Date/Time   WBC 7.3 08/31/2015 0545   RBC 2.97 (L) 08/31/2015 0545   HGB 9.6 (L) 08/31/2015 0545   HGB 11.2 (L) 08/26/2006 1250   HCT 28.0 (L) 08/31/2015 0545   HCT 33.2 (L) 08/26/2006 1250   PLT 182 08/31/2015 0545   PLT 290 08/26/2006 1250   MCV 94.3 08/31/2015 0545   MCV 92.5 08/26/2006 1250   MCH 32.3 08/31/2015 0545   MCHC 34.3 08/31/2015 0545   RDW 12.0 08/31/2015 0545   RDW 13.9 08/26/2006 1250   LYMPHSABS 1.3 08/26/2006 1250   MONOABS 0.5 08/26/2006 1250   EOSABS 0.0 08/26/2006 1250   BASOSABS 0.0 08/26/2006 1250    . CMP Latest Ref Rng & Units 05/08/2006  Glucose 70 - 99 mg/dL 110(H)  BUN 6 - 23 mg/dL 12  Creatinine 0.40 - 1.20 mg/dL 0.78  Sodium 135 - 145 mEq/L 138  Potassium 3.5 - 5.3 mEq/L 3.3(L)  Chloride 96 - 112 mEq/L 101  CO2 19 - 32 mEq/L 27  Calcium 8.4 - 10.5 mg/dL 9.4  Total Protein 6.0 - 8.3 g/dL 8.3  Total Bilirubin 0.3 - 1.2 mg/dL 0.3  Alkaline Phos 39 - 117 U/L 63  AST 0 - 37 U/L 18  ALT 0 - 40 U/L 9     RADIOGRAPHIC STUDIES: I have personally reviewed the radiological images as listed and agreed with the findings in the report. No results found.  ASSESSMENT & PLAN:  ***  All of the patients questions were answered with apparent satisfaction. The patient knows to call the clinic with any problems, questions or concerns.  I spent {CHL ONC TIME VISIT - [id number] counseling the patient face to face. The total time spent in the appointment was {CHL ONC TIME VISIT - [id number] and more than 50% was on counseling and direct patient cares.    Sullivan Lone MD [place] AAHIVMS [facility] Hematology/Oncology Physician [facility]  (Office):       [phone] (Work cell):  [phone] (Fax):           [phone]  05/16/2016 2:58 PM

## 2016-05-16 NOTE — Telephone Encounter (Signed)
Lab appointment  Added per 05/16/16 los. Follow up appointment based on lab results, per Md, per 05/16/16 los.

## 2016-05-17 LAB — TSH: TSH: 0.848 m(IU)/L (ref 0.308–3.960)

## 2016-05-17 LAB — VITAMIN B12: Vitamin B12: 279 pg/mL (ref 211–946)

## 2016-05-17 LAB — T4, FREE: T4,Free(Direct): 1.11 ng/dL (ref 0.82–1.77)

## 2016-06-07 ENCOUNTER — Ambulatory Visit
Admission: RE | Admit: 2016-06-07 | Discharge: 2016-06-07 | Disposition: A | Discharge status: Home / Self Care | Payer: Federal, State, Local not specified - PPO | Source: Ambulatory Visit | Attending: Family Medicine | Admitting: Family Medicine

## 2016-06-07 DIAGNOSIS — Z1231 Encounter for screening mammogram for malignant neoplasm of breast: Secondary | ICD-10-CM | POA: Diagnosis not present

## 2016-07-09 DIAGNOSIS — J0100 Acute maxillary sinusitis, unspecified: Secondary | ICD-10-CM | POA: Diagnosis not present

## 2017-04-15 DIAGNOSIS — Z01419 Encounter for gynecological examination (general) (routine) without abnormal findings: Secondary | ICD-10-CM | POA: Diagnosis not present

## 2017-04-28 ENCOUNTER — Other Ambulatory Visit: Payer: Self-pay | Admitting: Family Medicine

## 2017-04-28 DIAGNOSIS — Z1231 Encounter for screening mammogram for malignant neoplasm of breast: Secondary | ICD-10-CM

## 2017-05-24 DIAGNOSIS — Z6823 Body mass index (BMI) 23.0-23.9, adult: Secondary | ICD-10-CM | POA: Diagnosis not present

## 2017-05-24 DIAGNOSIS — R109 Unspecified abdominal pain: Secondary | ICD-10-CM | POA: Diagnosis not present

## 2017-05-24 DIAGNOSIS — R197 Diarrhea, unspecified: Secondary | ICD-10-CM | POA: Diagnosis not present

## 2017-06-09 ENCOUNTER — Ambulatory Visit
Admission: RE | Admit: 2017-06-09 | Discharge: 2017-06-09 | Disposition: A | Discharge status: Home / Self Care | Payer: Federal, State, Local not specified - PPO | Source: Ambulatory Visit | Attending: Family Medicine | Admitting: Family Medicine

## 2017-06-09 DIAGNOSIS — Z1231 Encounter for screening mammogram for malignant neoplasm of breast: Secondary | ICD-10-CM

## 2017-06-18 DIAGNOSIS — R946 Abnormal results of thyroid function studies: Secondary | ICD-10-CM | POA: Diagnosis not present

## 2017-06-18 DIAGNOSIS — R739 Hyperglycemia, unspecified: Secondary | ICD-10-CM | POA: Diagnosis not present

## 2017-06-18 DIAGNOSIS — I10 Essential (primary) hypertension: Secondary | ICD-10-CM | POA: Diagnosis not present

## 2017-06-18 DIAGNOSIS — E049 Nontoxic goiter, unspecified: Secondary | ICD-10-CM | POA: Diagnosis not present

## 2017-06-18 DIAGNOSIS — D72819 Decreased white blood cell count, unspecified: Secondary | ICD-10-CM | POA: Diagnosis not present

## 2017-06-18 DIAGNOSIS — E559 Vitamin D deficiency, unspecified: Secondary | ICD-10-CM | POA: Diagnosis not present

## 2017-12-12 DIAGNOSIS — M545 Low back pain: Secondary | ICD-10-CM | POA: Diagnosis not present

## 2017-12-12 DIAGNOSIS — R946 Abnormal results of thyroid function studies: Secondary | ICD-10-CM | POA: Diagnosis not present

## 2018-01-28 DIAGNOSIS — J019 Acute sinusitis, unspecified: Secondary | ICD-10-CM | POA: Diagnosis not present

## 2018-01-28 DIAGNOSIS — B9689 Other specified bacterial agents as the cause of diseases classified elsewhere: Secondary | ICD-10-CM | POA: Diagnosis not present

## 2018-01-29 DIAGNOSIS — I10 Essential (primary) hypertension: Secondary | ICD-10-CM | POA: Diagnosis not present

## 2018-01-29 DIAGNOSIS — R7303 Prediabetes: Secondary | ICD-10-CM | POA: Diagnosis not present

## 2018-01-29 DIAGNOSIS — G43009 Migraine without aura, not intractable, without status migrainosus: Secondary | ICD-10-CM | POA: Diagnosis not present

## 2018-01-29 DIAGNOSIS — E559 Vitamin D deficiency, unspecified: Secondary | ICD-10-CM | POA: Diagnosis not present

## 2018-03-27 ENCOUNTER — Other Ambulatory Visit: Payer: Self-pay | Admitting: Family Medicine

## 2018-03-27 DIAGNOSIS — Z1231 Encounter for screening mammogram for malignant neoplasm of breast: Secondary | ICD-10-CM

## 2018-04-06 DIAGNOSIS — J310 Chronic rhinitis: Secondary | ICD-10-CM | POA: Diagnosis not present

## 2018-06-02 ENCOUNTER — Ambulatory Visit
Admission: RE | Admit: 2018-06-02 | Discharge: 2018-06-02 | Disposition: A | Discharge status: Home / Self Care | Payer: Federal, State, Local not specified - PPO | Source: Ambulatory Visit | Attending: Family Medicine | Admitting: Family Medicine

## 2018-06-02 DIAGNOSIS — Z01419 Encounter for gynecological examination (general) (routine) without abnormal findings: Secondary | ICD-10-CM | POA: Diagnosis not present

## 2018-06-02 DIAGNOSIS — Z1231 Encounter for screening mammogram for malignant neoplasm of breast: Secondary | ICD-10-CM

## 2018-12-11 DIAGNOSIS — R739 Hyperglycemia, unspecified: Secondary | ICD-10-CM | POA: Diagnosis not present

## 2018-12-11 DIAGNOSIS — E559 Vitamin D deficiency, unspecified: Secondary | ICD-10-CM | POA: Diagnosis not present

## 2018-12-11 DIAGNOSIS — G43009 Migraine without aura, not intractable, without status migrainosus: Secondary | ICD-10-CM | POA: Diagnosis not present

## 2018-12-11 DIAGNOSIS — I10 Essential (primary) hypertension: Secondary | ICD-10-CM | POA: Diagnosis not present

## 2019-05-27 DIAGNOSIS — G4700 Insomnia, unspecified: Secondary | ICD-10-CM | POA: Diagnosis not present

## 2019-05-27 DIAGNOSIS — I10 Essential (primary) hypertension: Secondary | ICD-10-CM | POA: Diagnosis not present

## 2019-05-27 DIAGNOSIS — R7303 Prediabetes: Secondary | ICD-10-CM | POA: Diagnosis not present

## 2019-05-27 DIAGNOSIS — F419 Anxiety disorder, unspecified: Secondary | ICD-10-CM | POA: Diagnosis not present

## 2019-06-07 ENCOUNTER — Other Ambulatory Visit: Payer: Self-pay | Admitting: Family Medicine

## 2019-06-07 DIAGNOSIS — Z1231 Encounter for screening mammogram for malignant neoplasm of breast: Secondary | ICD-10-CM

## 2019-07-26 ENCOUNTER — Ambulatory Visit: Payer: Federal, State, Local not specified - PPO

## 2019-07-29 ENCOUNTER — Ambulatory Visit
Admission: RE | Admit: 2019-07-29 | Discharge: 2019-07-29 | Disposition: A | Discharge status: Home / Self Care | Payer: Federal, State, Local not specified - PPO | Source: Ambulatory Visit | Attending: Family Medicine | Admitting: Family Medicine

## 2019-07-29 ENCOUNTER — Other Ambulatory Visit: Payer: Self-pay

## 2019-07-29 DIAGNOSIS — Z1231 Encounter for screening mammogram for malignant neoplasm of breast: Secondary | ICD-10-CM | POA: Diagnosis not present

## 2019-09-01 DIAGNOSIS — Z01110 Encounter for hearing examination following failed hearing screening: Secondary | ICD-10-CM | POA: Diagnosis not present

## 2019-09-01 DIAGNOSIS — Z1152 Encounter for screening for COVID-19: Secondary | ICD-10-CM | POA: Diagnosis not present

## 2019-09-15 DIAGNOSIS — H00011 Hordeolum externum right upper eyelid: Secondary | ICD-10-CM | POA: Diagnosis not present

## 2019-11-03 ENCOUNTER — Telehealth: Payer: Self-pay

## 2019-11-03 NOTE — Telephone Encounter (Signed)
Pt. Asking if she can take Tylenol and Sudafed the day before her COVID 19 vaccine. Instructed she can.

## 2019-11-04 ENCOUNTER — Ambulatory Visit: Payer: Federal, State, Local not specified - PPO | Attending: Internal Medicine

## 2019-11-04 DIAGNOSIS — Z23 Encounter for immunization: Secondary | ICD-10-CM

## 2019-11-04 NOTE — Progress Notes (Signed)
   Covid-19 Vaccination Clinic  Name:  Christy Griffin    MRN: [id number] DOB: 1961/02/14  11/04/2019  [name] was observed post Covid-19 immunization for 15 minutes without incident. She was provided with Vaccine Information Sheet and instruction to access the V-Safe system.   [name] was instructed to call 911 with any severe reactions post vaccine: Marland Kitchen Difficulty breathing  . Swelling of face and throat  . A fast heartbeat  . A bad rash all over body  . Dizziness and weakness   Immunizations Administered    Name Date Dose VIS Date Route   Pfizer COVID-19 Vaccine 11/04/2019  9:47 AM 0.3 mL 07/30/2019 Intramuscular   Manufacturer: [place]   Lot: [id number]   [place]: [id number]

## 2019-11-08 ENCOUNTER — Ambulatory Visit: Payer: Federal, State, Local not specified - PPO

## 2019-12-01 ENCOUNTER — Ambulatory Visit: Payer: Federal, State, Local not specified - PPO

## 2019-12-03 ENCOUNTER — Ambulatory Visit: Payer: Federal, State, Local not specified - PPO | Attending: Internal Medicine

## 2019-12-03 DIAGNOSIS — Z23 Encounter for immunization: Secondary | ICD-10-CM

## 2019-12-03 NOTE — Progress Notes (Signed)
   Covid-19 Vaccination Clinic  Name:  Christy Griffin    MRN: [id number] DOB: 1961/02/16  12/03/2019  [name] was observed post Covid-19 immunization for 15 minutes without incident. She was provided with Vaccine Information Sheet and instruction to access the V-Safe system.   [name] was instructed to call 911 with any severe reactions post vaccine: Marland Kitchen Difficulty breathing  . Swelling of face and throat  . A fast heartbeat  . A bad rash all over body  . Dizziness and weakness   Immunizations Administered    Name Date Dose VIS Date Route   Pfizer COVID-19 Vaccine 12/03/2019  8:48 AM 0.3 mL 07/30/2019 Intramuscular   Manufacturer: [place]   Lot: [id number]   [place]: [id number]

## 2020-02-02 DIAGNOSIS — G43009 Migraine without aura, not intractable, without status migrainosus: Secondary | ICD-10-CM | POA: Diagnosis not present

## 2020-02-02 DIAGNOSIS — I10 Essential (primary) hypertension: Secondary | ICD-10-CM | POA: Diagnosis not present

## 2020-02-02 DIAGNOSIS — E559 Vitamin D deficiency, unspecified: Secondary | ICD-10-CM | POA: Diagnosis not present

## 2020-02-15 DIAGNOSIS — E559 Vitamin D deficiency, unspecified: Secondary | ICD-10-CM | POA: Diagnosis not present

## 2020-02-15 DIAGNOSIS — R7303 Prediabetes: Secondary | ICD-10-CM | POA: Diagnosis not present

## 2020-02-15 DIAGNOSIS — I10 Essential (primary) hypertension: Secondary | ICD-10-CM | POA: Diagnosis not present

## 2020-05-04 DIAGNOSIS — L821 Other seborrheic keratosis: Secondary | ICD-10-CM | POA: Diagnosis not present

## 2020-05-04 DIAGNOSIS — L2084 Intrinsic (allergic) eczema: Secondary | ICD-10-CM | POA: Diagnosis not present

## 2020-05-04 DIAGNOSIS — L658 Other specified nonscarring hair loss: Secondary | ICD-10-CM | POA: Diagnosis not present

## 2020-05-04 DIAGNOSIS — D2361 Other benign neoplasm of skin of right upper limb, including shoulder: Secondary | ICD-10-CM | POA: Diagnosis not present

## 2020-05-17 ENCOUNTER — Other Ambulatory Visit: Payer: Self-pay | Admitting: Family Medicine

## 2020-05-17 DIAGNOSIS — Z1231 Encounter for screening mammogram for malignant neoplasm of breast: Secondary | ICD-10-CM

## 2020-07-10 DIAGNOSIS — L648 Other androgenic alopecia: Secondary | ICD-10-CM | POA: Diagnosis not present

## 2020-07-10 DIAGNOSIS — L659 Nonscarring hair loss, unspecified: Secondary | ICD-10-CM | POA: Diagnosis not present

## 2020-07-18 ENCOUNTER — Ambulatory Visit
Admission: EM | Admit: 2020-07-18 | Discharge: 2020-07-18 | Disposition: A | Discharge status: Home / Self Care | Payer: Federal, State, Local not specified - PPO | Attending: Emergency Medicine | Admitting: Emergency Medicine

## 2020-07-18 DIAGNOSIS — Z20822 Contact with and (suspected) exposure to covid-19: Secondary | ICD-10-CM | POA: Insufficient documentation

## 2020-07-18 DIAGNOSIS — J029 Acute pharyngitis, unspecified: Secondary | ICD-10-CM | POA: Diagnosis not present

## 2020-07-18 HISTORY — DX: Essential (primary) hypertension: I10

## 2020-07-18 LAB — POCT RAPID STREP A (OFFICE): Rapid Strep A Screen: NEGATIVE

## 2020-07-18 NOTE — Discharge Instructions (Signed)
Zyrtec once daily, flonase (2 sprays in each nostril daily) Strep culture pending: check MyChart for results.

## 2020-07-18 NOTE — ED Triage Notes (Signed)
Pt c/o sore throat, runny nose, and headaches since yesterday. Requesting strep and covid testing.

## 2020-07-18 NOTE — ED Provider Notes (Signed)
[facility]    CSN: [id number] Arrival date & time: 07/18/20  1253      History   Chief Complaint Chief Complaint  Patient presents with  . Sore Throat    HPI Christy Griffin is a 59 y.o. female  With history as below presenting for sore throat, runny nose, headache since yesterday.  Requesting strep and Covid testing.  Denies known sick contacts, though does work for [employer].  Denies chest pain, difficulty breathing, fever.  Past Medical History:  Diagnosis Date  . Anginal pain ([place])   . Headache   . Hypertension     Patient Active Problem List   Diagnosis Date Noted  . Fibroids, intramural 08/30/2015  . Right ovarian cyst 08/30/2015  . Essential hypertension 08/30/2015  . S/P laparoscopic assisted vaginal hysterectomy (LAVH) 08/30/2015    Past Surgical History:  Procedure Laterality Date  . LAPAROSCOPIC VAGINAL HYSTERECTOMY WITH SALPINGO OOPHORECTOMY N/A 08/30/2015   Procedure: LAPAROSCOPIC ASSISTED VAGINAL HYSTERECTOMY ;  Surgeon: Christophe Louis, MD;  Location: [place] [facility];  Service: Gynecology;  Laterality: N/A;  . SALPINGOOPHORECTOMY Bilateral 08/30/2015   Procedure: WITH BILATERAL SALPINGO OOPHORECTOMY;  Surgeon: Christophe Louis, MD;  Location: [place] [facility];  Service: Gynecology;  Laterality: Bilateral;  . TUBAL LIGATION      OB History   No obstetric history on file.      Home Medications    Prior to Admission medications   Medication Sig Start Date End Date Taking? Authorizing Provider  BIOTIN PO Take 1 tablet by mouth daily.    [provider]  ibuprofen (ADVIL,MOTRIN) 800 MG tablet Take 1 tablet (800 mg total) by mouth every 8 (eight) hours as needed (mild pain). 08/31/15   Christophe Louis, MD  losartan-hydrochlorothiazide [facility]) 50-12.5 MG tablet Take 0.5 tablets by mouth daily.    [provider]    Family History Family History  Problem Relation Age of Onset  . Breast cancer Neg Hx     Social History Social History   Tobacco Use  .  Smoking status: Never Smoker  . Smokeless tobacco: Never Used  Substance Use Topics  . Alcohol use: No  . Drug use: No     Allergies   Rubbing alcohol [alcohol]   Review of Systems Review of Systems  Constitutional: Negative for fatigue and fever.  HENT: Positive for rhinorrhea and sore throat. Negative for congestion, dental problem, ear pain, facial swelling, hearing loss, sinus pain, trouble swallowing and voice change.   Eyes: Negative for photophobia, pain and visual disturbance.  Respiratory: Negative for cough and shortness of breath.   Cardiovascular: Negative for chest pain and palpitations.  Gastrointestinal: Negative for diarrhea and vomiting.  Musculoskeletal: Negative for arthralgias and myalgias.  Neurological: Positive for headaches. Negative for dizziness and weakness.     Physical Exam Triage Vital Signs ED Triage Vitals  Enc Vitals Group     BP 07/18/20 1412 (!) 157/102     Pulse Rate 07/18/20 1412 98     Resp 07/18/20 1412 20     Temp 07/18/20 1412 98.5 F (36.9 C)     Temp Source 07/18/20 1412 Oral     SpO2 07/18/20 1412 96 %     Weight --      Height --      Head Circumference --      Peak Flow --      Pain Score 07/18/20 1434 5     Pain Loc --      Pain  Edu? --      Excl. in [place]? --    No data found.  Updated Vital Signs BP (!) 157/102 (BP Location: Left Arm)   Pulse 98   Temp 98.5 F (36.9 C) (Oral)   Resp 20   SpO2 96%   Visual Acuity Right Eye Distance:   Left Eye Distance:   Bilateral Distance:    Right Eye Near:   Left Eye Near:    Bilateral Near:     Physical Exam Constitutional:      General: She is not in acute distress.    Appearance: She is not ill-appearing or diaphoretic.  HENT:     Head: Normocephalic and atraumatic.     Right Ear: Tympanic membrane and ear canal normal.     Left Ear: Tympanic membrane and ear canal normal.     Mouth/Throat:     Mouth: Mucous membranes are moist.     Pharynx: Oropharynx is  clear. No oropharyngeal exudate or posterior oropharyngeal erythema.  Eyes:     General: No scleral icterus.    Conjunctiva/sclera: Conjunctivae normal.     Pupils: Pupils are equal, round, and reactive to light.  Neck:     Comments: Trachea midline, negative JVD Cardiovascular:     Rate and Rhythm: Normal rate and regular rhythm.     Heart sounds: No murmur heard.  No gallop.   Pulmonary:     Effort: Pulmonary effort is normal. No respiratory distress.     Breath sounds: No wheezing, rhonchi or rales.  Musculoskeletal:     Cervical back: Neck supple. No tenderness.  Lymphadenopathy:     Cervical: No cervical adenopathy.  Skin:    Capillary Refill: Capillary refill takes less than 2 seconds.     Coloration: Skin is not jaundiced or pale.     Findings: No rash.  Neurological:     General: No focal deficit present.     Mental Status: She is alert and oriented to person, place, and time.      UC Treatments / Results  Labs (all labs ordered are listed, but only abnormal results are displayed) Labs Reviewed  NOVEL CORONAVIRUS, NAA  CULTURE, GROUP A STREP [facility])  POCT RAPID STREP A (OFFICE)    EKG   Radiology No results found.  Procedures Procedures (including critical care time)  Medications Ordered in UC Medications - No data to display  Initial Impression / Assessment and Plan / UC Course  I have reviewed the triage vital signs and the nursing notes.  Pertinent labs & imaging results that were available during my care of the patient were reviewed by me and considered in my medical decision making (see chart for details).     Patient afebrile, nontoxic, with SpO2 96%.  Rapid strep negative, culture pending.  Covid PCR pending.  Patient to quarantine until results are back.  We will treat supportively as outlined below.  Return precautions discussed, patient verbalized understanding and is agreeable to plan. Final Clinical Impressions(s) / UC Diagnoses   Final  diagnoses:  Encounter for screening laboratory testing for COVID-19 virus  Sore throat     Discharge Instructions     Zyrtec once daily, flonase (2 sprays in each nostril daily) Strep culture pending: check MyChart for results.    ED Prescriptions    None     PDMP not reviewed this encounter.   [place], [place], [place] 07/18/20 1626

## 2020-07-19 LAB — SARS-COV-2, NAA 2 DAY TAT

## 2020-07-19 LAB — NOVEL CORONAVIRUS, NAA: SARS-CoV-2, NAA: NOT DETECTED

## 2020-07-21 LAB — CULTURE, GROUP A STREP (THRC)

## 2020-07-31 ENCOUNTER — Other Ambulatory Visit: Payer: Self-pay

## 2020-07-31 ENCOUNTER — Ambulatory Visit
Admission: RE | Admit: 2020-07-31 | Discharge: 2020-07-31 | Disposition: A | Discharge status: Home / Self Care | Payer: Federal, State, Local not specified - PPO | Source: Ambulatory Visit | Attending: Family Medicine | Admitting: Family Medicine

## 2020-07-31 DIAGNOSIS — Z1231 Encounter for screening mammogram for malignant neoplasm of breast: Secondary | ICD-10-CM

## 2020-09-14 DIAGNOSIS — I10 Essential (primary) hypertension: Secondary | ICD-10-CM | POA: Diagnosis not present

## 2020-09-14 DIAGNOSIS — G43009 Migraine without aura, not intractable, without status migrainosus: Secondary | ICD-10-CM | POA: Diagnosis not present

## 2020-09-15 DIAGNOSIS — Z20822 Contact with and (suspected) exposure to covid-19: Secondary | ICD-10-CM | POA: Diagnosis not present

## 2020-10-10 DIAGNOSIS — Z20822 Contact with and (suspected) exposure to covid-19: Secondary | ICD-10-CM | POA: Diagnosis not present

## 2020-10-10 DIAGNOSIS — Z03818 Encounter for observation for suspected exposure to other biological agents ruled out: Secondary | ICD-10-CM | POA: Diagnosis not present

## 2020-11-21 DIAGNOSIS — L648 Other androgenic alopecia: Secondary | ICD-10-CM | POA: Diagnosis not present

## 2021-06-12 ENCOUNTER — Other Ambulatory Visit: Payer: Self-pay | Admitting: Family Medicine

## 2021-06-12 DIAGNOSIS — Z1231 Encounter for screening mammogram for malignant neoplasm of breast: Secondary | ICD-10-CM

## 2021-06-21 DIAGNOSIS — L650 Telogen effluvium: Secondary | ICD-10-CM | POA: Diagnosis not present

## 2021-06-21 DIAGNOSIS — E611 Iron deficiency: Secondary | ICD-10-CM | POA: Diagnosis not present

## 2021-06-21 DIAGNOSIS — L669 Cicatricial alopecia, unspecified: Secondary | ICD-10-CM | POA: Diagnosis not present

## 2021-08-01 ENCOUNTER — Ambulatory Visit
Admission: RE | Admit: 2021-08-01 | Discharge: 2021-08-01 | Disposition: A | Discharge status: Home / Self Care | Payer: Federal, State, Local not specified - PPO | Source: Ambulatory Visit | Attending: Family Medicine | Admitting: Family Medicine

## 2021-08-01 ENCOUNTER — Other Ambulatory Visit: Payer: Self-pay

## 2021-08-01 DIAGNOSIS — E611 Iron deficiency: Secondary | ICD-10-CM | POA: Diagnosis not present

## 2021-08-01 DIAGNOSIS — Z1231 Encounter for screening mammogram for malignant neoplasm of breast: Secondary | ICD-10-CM

## 2021-08-01 DIAGNOSIS — I10 Essential (primary) hypertension: Secondary | ICD-10-CM | POA: Diagnosis not present

## 2021-08-01 DIAGNOSIS — R7303 Prediabetes: Secondary | ICD-10-CM | POA: Diagnosis not present

## 2021-08-22 DIAGNOSIS — R0981 Nasal congestion: Secondary | ICD-10-CM | POA: Diagnosis not present

## 2021-08-22 DIAGNOSIS — R059 Cough, unspecified: Secondary | ICD-10-CM | POA: Diagnosis not present

## 2021-08-22 DIAGNOSIS — R0982 Postnasal drip: Secondary | ICD-10-CM | POA: Diagnosis not present

## 2021-08-22 DIAGNOSIS — J0100 Acute maxillary sinusitis, unspecified: Secondary | ICD-10-CM | POA: Diagnosis not present

## 2022-02-06 DIAGNOSIS — Z789 Other specified health status: Secondary | ICD-10-CM | POA: Diagnosis not present

## 2022-02-06 DIAGNOSIS — I10 Essential (primary) hypertension: Secondary | ICD-10-CM | POA: Diagnosis not present

## 2022-02-06 DIAGNOSIS — M79622 Pain in left upper arm: Secondary | ICD-10-CM | POA: Diagnosis not present

## 2022-03-04 DIAGNOSIS — L669 Cicatricial alopecia, unspecified: Secondary | ICD-10-CM | POA: Diagnosis not present

## 2022-03-04 DIAGNOSIS — L650 Telogen effluvium: Secondary | ICD-10-CM | POA: Diagnosis not present

## 2022-03-04 DIAGNOSIS — L658 Other specified nonscarring hair loss: Secondary | ICD-10-CM | POA: Diagnosis not present

## 2022-04-17 ENCOUNTER — Other Ambulatory Visit: Payer: Self-pay | Admitting: Family Medicine

## 2022-04-18 ENCOUNTER — Other Ambulatory Visit: Payer: Self-pay | Admitting: Family Medicine

## 2022-04-18 DIAGNOSIS — M79622 Pain in left upper arm: Secondary | ICD-10-CM

## 2022-05-06 ENCOUNTER — Ambulatory Visit
Admission: RE | Admit: 2022-05-06 | Discharge: 2022-05-06 | Disposition: A | Discharge status: Home / Self Care | Payer: Federal, State, Local not specified - PPO | Source: Ambulatory Visit | Attending: Family Medicine | Admitting: Family Medicine

## 2022-05-06 ENCOUNTER — Ambulatory Visit: Payer: Federal, State, Local not specified - PPO

## 2022-05-06 DIAGNOSIS — M79622 Pain in left upper arm: Secondary | ICD-10-CM

## 2022-05-06 DIAGNOSIS — N644 Mastodynia: Secondary | ICD-10-CM | POA: Diagnosis not present

## 2022-06-27 ENCOUNTER — Other Ambulatory Visit: Payer: Self-pay | Admitting: Family Medicine

## 2022-06-27 DIAGNOSIS — Z1231 Encounter for screening mammogram for malignant neoplasm of breast: Secondary | ICD-10-CM

## 2022-07-15 IMAGING — MG MM DIGITAL SCREENING BILAT W/ TOMO AND CAD
8 series · 8 of 24 positions shown · non-contrast
Comparison: Previous exam(s).

ACR Breast Density Category a: The breast tissue is almost entirely
fatty.

CLINICAL DATA: Screening.

EXAM:
DIGITAL SCREENING BILATERAL MAMMOGRAM WITH TOMOSYNTHESIS AND CAD
TECHNIQUE: Bilateral screening digital craniocaudal and mediolateral oblique
mammograms were obtained. Bilateral screening digital breast
tomosynthesis was performed. The images were evaluated with
computer-aided detection.

[L CC synth-2D]
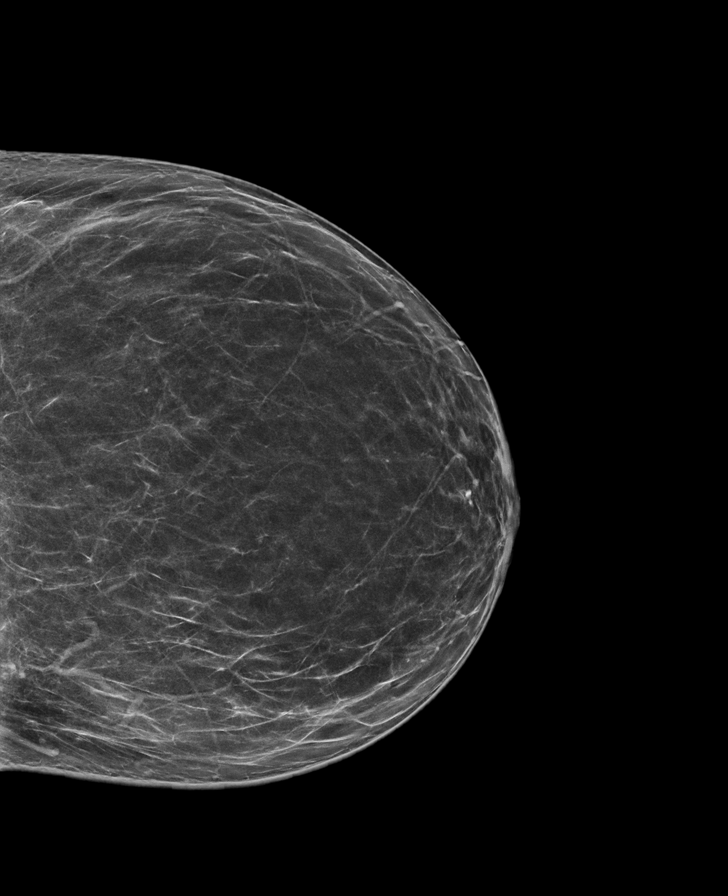

[R MLO synth-2D]
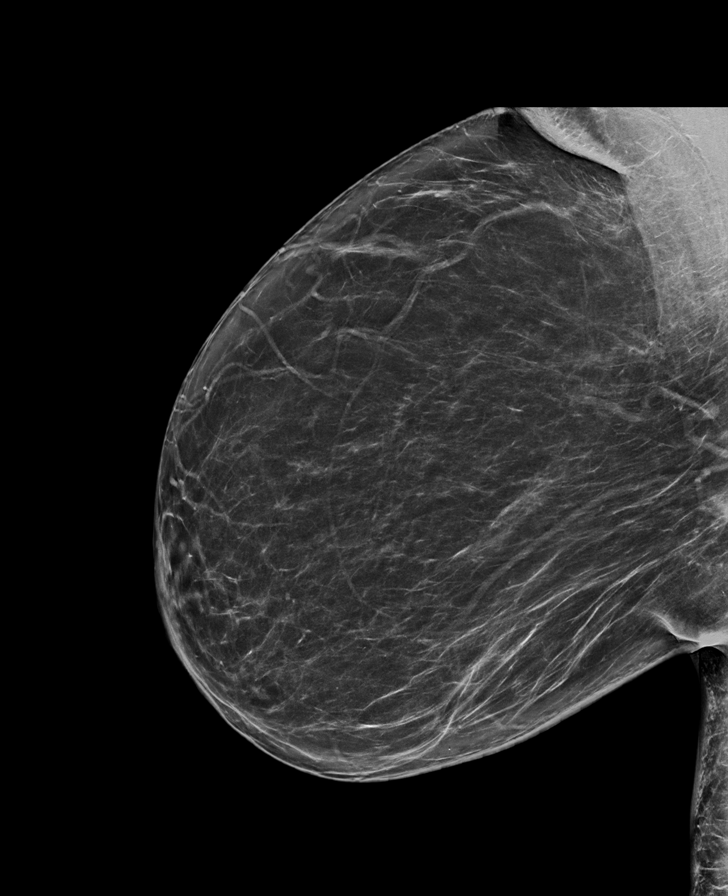

[L MLO synth-2D]
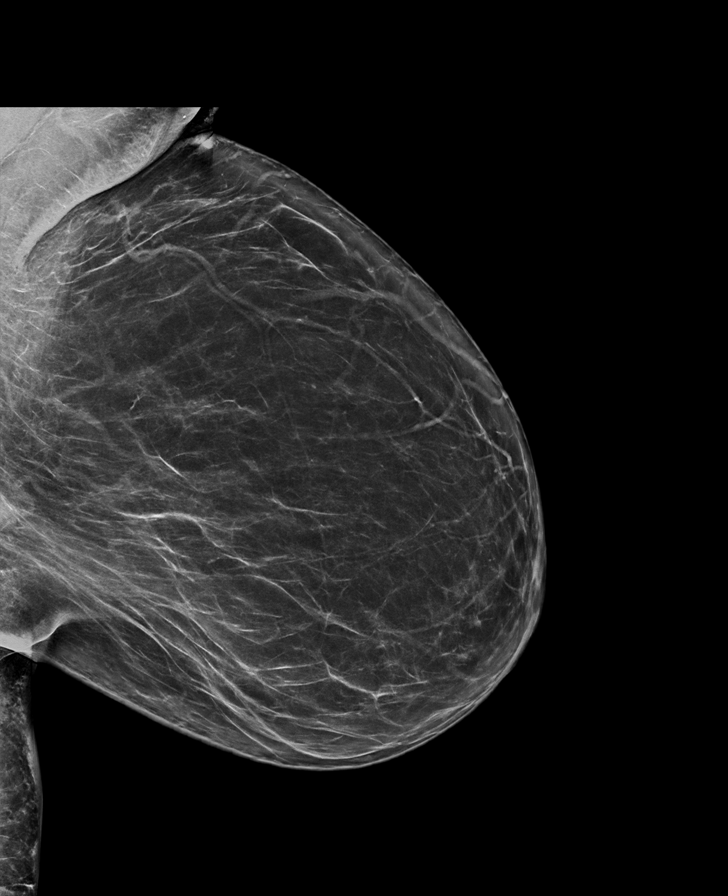

[R CC synth-2D]
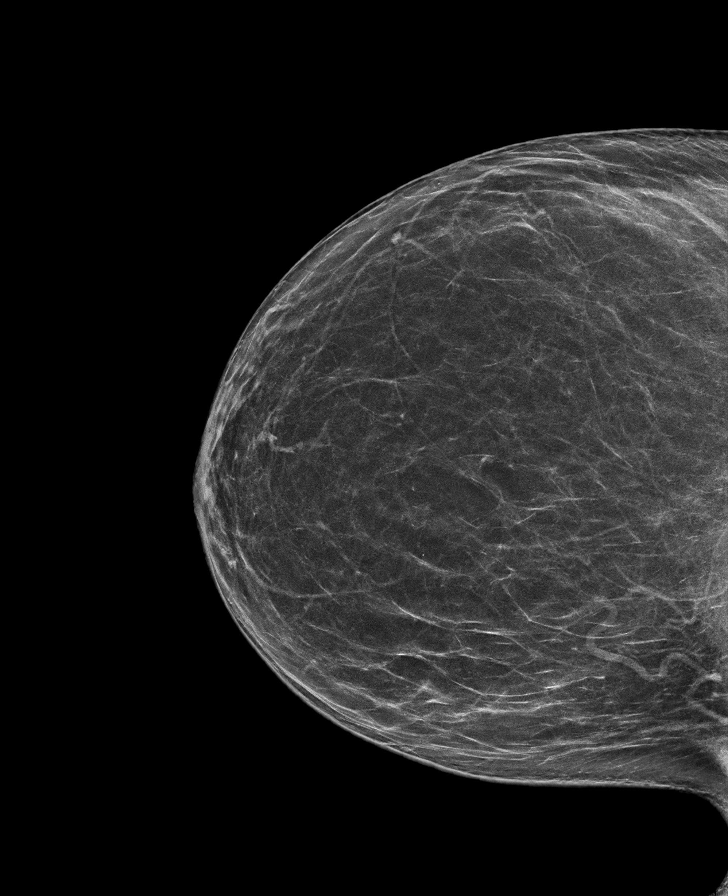

[R CC tomo · tomo slice 37/73.0]
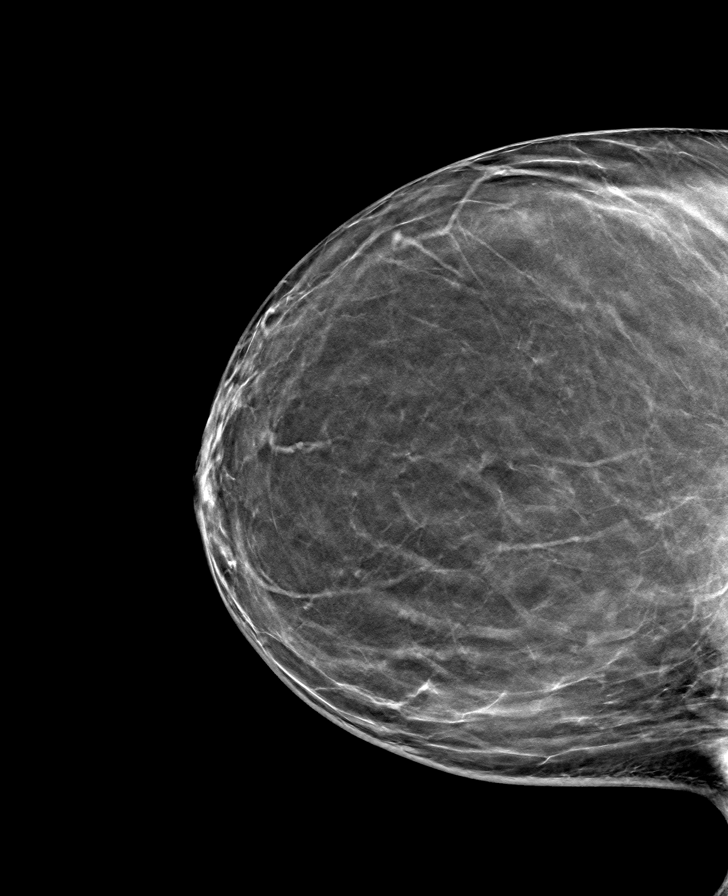

[L MLO tomo · tomo slice 43/84.0]
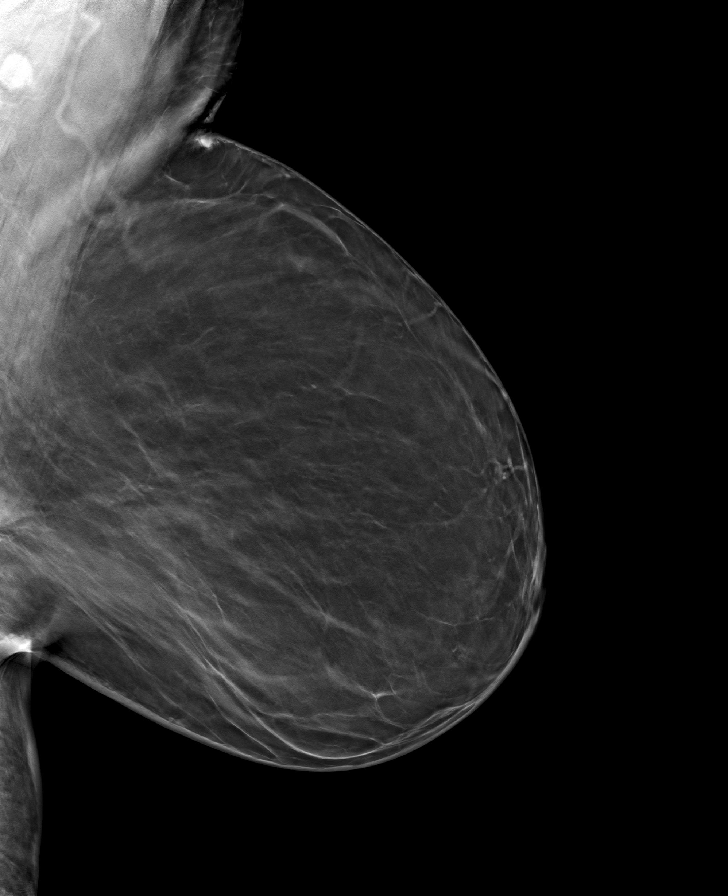

[R MLO tomo · tomo slice 43/84.0]
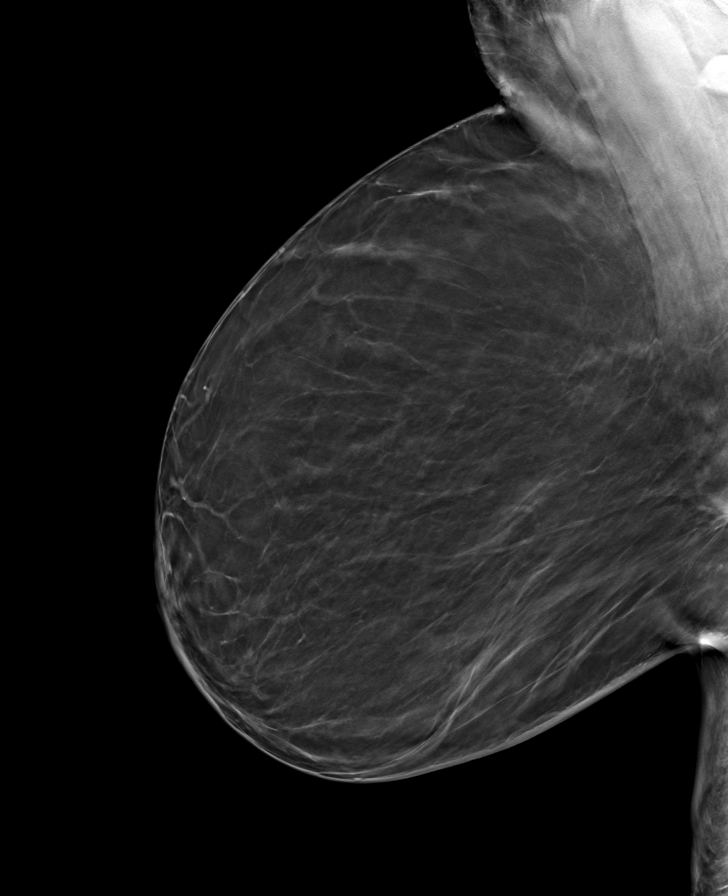

[L CC tomo · tomo slice 36/71.0]
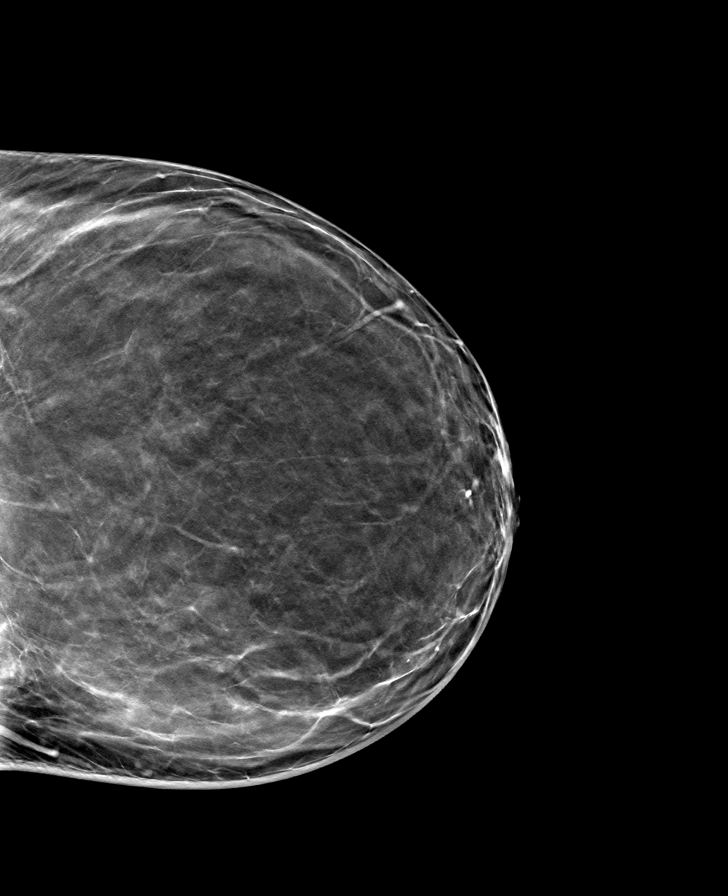

[8 of 24 positions shown; findings below may reference images not displayed]

FINDINGS: There are no findings suspicious for malignancy.
IMPRESSION: No mammographic evidence of malignancy. A result letter of this
screening mammogram will be mailed directly to the patient.

RECOMMENDATION:
Screening mammogram in one year. (Code:0E-3-N98)

BI-RADS CATEGORY  1: Negative.

## 2022-07-27 ENCOUNTER — Encounter: Payer: Self-pay | Admitting: Emergency Medicine

## 2022-07-27 ENCOUNTER — Other Ambulatory Visit: Payer: Self-pay

## 2022-07-27 ENCOUNTER — Ambulatory Visit
Admission: EM | Admit: 2022-07-27 | Discharge: 2022-07-27 | Disposition: A | Discharge status: Home / Self Care | Payer: Federal, State, Local not specified - PPO | Attending: Physician Assistant | Admitting: Physician Assistant

## 2022-07-27 DIAGNOSIS — J069 Acute upper respiratory infection, unspecified: Secondary | ICD-10-CM | POA: Diagnosis not present

## 2022-07-27 DIAGNOSIS — Z1152 Encounter for screening for COVID-19: Secondary | ICD-10-CM | POA: Insufficient documentation

## 2022-07-27 DIAGNOSIS — J0100 Acute maxillary sinusitis, unspecified: Secondary | ICD-10-CM | POA: Diagnosis not present

## 2022-07-27 DIAGNOSIS — L309 Dermatitis, unspecified: Secondary | ICD-10-CM | POA: Diagnosis not present

## 2022-07-27 LAB — RESP PANEL BY RT-PCR (FLU A&B, COVID) ARPGX2
Influenza A by PCR: NEGATIVE
Influenza B by PCR: NEGATIVE
SARS Coronavirus 2 by RT PCR: NEGATIVE

## 2022-07-27 MED ORDER — MOMETASONE FUROATE 0.1 % EX CREA: 1.0000 | TOPICAL_CREAM | Freq: Every day | CUTANEOUS | 0 refills | Status: AC

## 2022-07-27 MED ORDER — AMOXICILLIN-POT CLAVULANATE 875-125 MG PO TABS: 1.0000 | ORAL_TABLET | Freq: Two times a day (BID) | ORAL | 0 refills | Status: AC

## 2022-07-27 NOTE — ED Provider Notes (Signed)
[facility]    CSN: [id number] Arrival date & time: 07/27/22  0815      History   Chief Complaint Chief Complaint  Patient presents with   Facial Pain    HPI Christy Griffin is a 61 y.o. female.   Patient here today for evaluation of sinus pressure nasal congestion she has had for the last week.  She reports that yesterday she started to have fever.  She is unsure how high her fever became.  She did have body aches yesterday as well.  She has taken over-the-counter medication without resolution.  She has not had any ear pain, nausea, vomiting or diarrhea.  She also requests mometasone for treatment of her eczema. She is not currently having a flare but likes to keep medication on hand if needed.   The history is provided by the patient.    Past Medical History:  Diagnosis Date   Anginal pain ([place])    Headache    Hypertension     Patient Active Problem List   Diagnosis Date Noted   Fibroids, intramural 08/30/2015   Right ovarian cyst 08/30/2015   Essential hypertension 08/30/2015   S/P laparoscopic assisted vaginal hysterectomy (LAVH) 08/30/2015    Past Surgical History:  Procedure Laterality Date   LAPAROSCOPIC VAGINAL HYSTERECTOMY WITH SALPINGO OOPHORECTOMY N/A 08/30/2015   Procedure: LAPAROSCOPIC ASSISTED VAGINAL HYSTERECTOMY ;  Surgeon: Christophe Louis, MD;  Location: [facility];  Service: Gynecology;  Laterality: N/A;   SALPINGOOPHORECTOMY Bilateral 08/30/2015   Procedure: WITH BILATERAL SALPINGO OOPHORECTOMY;  Surgeon: Christophe Louis, MD;  Location: [facility];  Service: Gynecology;  Laterality: Bilateral;   TUBAL LIGATION      OB History   No obstetric history on file.      Home Medications    Prior to Admission medications   Medication Sig Start Date End Date Taking? Authorizing Provider  amoxicillin-clavulanate (AUGMENTIN) 875-125 MG tablet Take 1 tablet by mouth every 12 (twelve) hours. 07/27/22  Yes Francene Finders, PA-C  mometasone (ELOCON) 0.1 % cream  Apply 1 Application topically daily. 07/27/22  Yes Francene Finders, PA-C  BIOTIN PO Take 1 tablet by mouth daily.    [provider]  ibuprofen (ADVIL,MOTRIN) 800 MG tablet Take 1 tablet (800 mg total) by mouth every 8 (eight) hours as needed (mild pain). 08/31/15   Christophe Louis, MD  losartan-hydrochlorothiazide [facility]) 50-12.5 MG tablet Take 0.5 tablets by mouth daily.    [provider]    Family History Family History  Problem Relation Age of Onset   Breast cancer Neg Hx     Social History Social History   Tobacco Use   Smoking status: Never   Smokeless tobacco: Never  Substance Use Topics   Alcohol use: No   Drug use: No     Allergies   Rubbing alcohol [alcohol]   Review of Systems Review of Systems  Constitutional:  Positive for chills and fever.  HENT:  Positive for congestion, sinus pressure and sore throat. Negative for ear pain.   Eyes:  Negative for discharge and redness.  Respiratory:  Positive for cough. Negative for shortness of breath and wheezing.   Gastrointestinal:  Negative for abdominal pain, diarrhea, nausea and vomiting.  Musculoskeletal:  Positive for myalgias.     Physical Exam Triage Vital Signs ED Triage Vitals  Enc Vitals Group     BP 07/27/22 0855 (!) 184/96     Pulse Rate 07/27/22 0855 94     Resp 07/27/22  0855 18     Temp 07/27/22 0855 99.5 F (37.5 C)     Temp Source 07/27/22 0855 Oral     SpO2 07/27/22 0855 97 %     Weight --      Height --      Head Circumference --      Peak Flow --      Pain Score 07/27/22 0856 3     Pain Loc --      Pain Edu? --      Excl. in [place]? --    No data found.  Updated Vital Signs BP (!) 184/96 (BP Location: Left Arm)   Pulse 94   Temp 99.5 F (37.5 C) (Oral)   Resp 18   SpO2 97%      Physical Exam Vitals and nursing note reviewed.  Constitutional:      General: She is not in acute distress.    Appearance: Normal appearance. She is not ill-appearing.  HENT:     Head:  Normocephalic and atraumatic.     Nose: Congestion present.     Mouth/Throat:     Mouth: Mucous membranes are moist.     Pharynx: No oropharyngeal exudate or posterior oropharyngeal erythema.  Eyes:     Conjunctiva/sclera: Conjunctivae normal.  Cardiovascular:     Rate and Rhythm: Normal rate and regular rhythm.     Heart sounds: Normal heart sounds. No murmur heard. Pulmonary:     Effort: Pulmonary effort is normal. No respiratory distress.     Breath sounds: Normal breath sounds. No wheezing, rhonchi or rales.  Skin:    General: Skin is warm and dry.  Neurological:     Mental Status: She is alert.  Psychiatric:        Mood and Affect: Mood normal.        Thought Content: Thought content normal.      UC Treatments / Results  Labs (all labs ordered are listed, but only abnormal results are displayed) Labs Reviewed  RESP PANEL BY RT-PCR (FLU A&B, COVID) [id number]    EKG   Radiology No results found.  Procedures Procedures (including critical care time)  Medications Ordered in UC Medications - No data to display  Initial Impression / Assessment and Plan / UC Course  I have reviewed the triage vital signs and the nursing notes.  Pertinent labs & imaging results that were available during my care of the patient were reviewed by me and considered in my medical decision making (see chart for details).    Augmentin prescribed to cover sinusitis.  Will order COVID and flu screening given acute onset of fever yesterday.  Encouraged symptomatic treatment otherwise.  Recommended further evaluation if no gradual improvement or with any further concerns.  Mometasone refilled as requested.  Final Clinical Impressions(s) / UC Diagnoses   Final diagnoses:  Acute maxillary sinusitis, recurrence not specified  Acute upper respiratory infection  Encounter for screening for COVID-19  Eczema, unspecified type   Discharge Instructions   None    ED Prescriptions      Medication Sig Dispense Auth. Provider   amoxicillin-clavulanate (AUGMENTIN) 875-125 MG tablet Take 1 tablet by mouth every 12 (twelve) hours. 14 tablet Ewell Poe F, PA-C   mometasone (ELOCON) 0.1 % cream Apply 1 Application topically daily. 45 g Francene Finders, PA-C      PDMP not reviewed this encounter.   Francene Finders, PA-C 07/27/22 1004

## 2022-07-27 NOTE — ED Triage Notes (Signed)
Pt here for sinus pressure and nasal congestion x 1 week

## 2022-08-02 ENCOUNTER — Ambulatory Visit
Admission: RE | Admit: 2022-08-02 | Discharge: 2022-08-02 | Disposition: A | Discharge status: Home / Self Care | Payer: Federal, State, Local not specified - PPO | Source: Ambulatory Visit | Attending: Family Medicine | Admitting: Family Medicine

## 2022-08-02 DIAGNOSIS — Z1231 Encounter for screening mammogram for malignant neoplasm of breast: Secondary | ICD-10-CM

## 2022-08-21 DIAGNOSIS — E559 Vitamin D deficiency, unspecified: Secondary | ICD-10-CM | POA: Diagnosis not present

## 2022-08-21 DIAGNOSIS — G43009 Migraine without aura, not intractable, without status migrainosus: Secondary | ICD-10-CM | POA: Diagnosis not present

## 2022-08-21 DIAGNOSIS — R7303 Prediabetes: Secondary | ICD-10-CM | POA: Diagnosis not present

## 2022-08-21 DIAGNOSIS — J0110 Acute frontal sinusitis, unspecified: Secondary | ICD-10-CM | POA: Diagnosis not present

## 2022-08-21 DIAGNOSIS — I10 Essential (primary) hypertension: Secondary | ICD-10-CM | POA: Diagnosis not present

## 2022-09-19 DIAGNOSIS — L658 Other specified nonscarring hair loss: Secondary | ICD-10-CM | POA: Diagnosis not present

## 2023-04-01 DIAGNOSIS — Z0100 Encounter for examination of eyes and vision without abnormal findings: Secondary | ICD-10-CM | POA: Diagnosis not present

## 2023-04-01 DIAGNOSIS — H2513 Age-related nuclear cataract, bilateral: Secondary | ICD-10-CM | POA: Diagnosis not present

## 2023-04-15 DIAGNOSIS — I10 Essential (primary) hypertension: Secondary | ICD-10-CM | POA: Diagnosis not present

## 2023-04-15 DIAGNOSIS — R110 Nausea: Secondary | ICD-10-CM | POA: Diagnosis not present

## 2023-04-15 DIAGNOSIS — R7303 Prediabetes: Secondary | ICD-10-CM | POA: Diagnosis not present

## 2023-07-08 ENCOUNTER — Other Ambulatory Visit: Payer: Self-pay | Admitting: Family Medicine

## 2023-07-08 DIAGNOSIS — Z1231 Encounter for screening mammogram for malignant neoplasm of breast: Secondary | ICD-10-CM

## 2023-08-07 ENCOUNTER — Ambulatory Visit
Admission: RE | Admit: 2023-08-07 | Discharge: 2023-08-07 | Disposition: A | Discharge status: Home / Self Care | Payer: Federal, State, Local not specified - PPO | Source: Ambulatory Visit | Attending: Family Medicine | Admitting: Family Medicine

## 2023-08-07 DIAGNOSIS — Z1231 Encounter for screening mammogram for malignant neoplasm of breast: Secondary | ICD-10-CM

## 2023-09-25 DIAGNOSIS — L658 Other specified nonscarring hair loss: Secondary | ICD-10-CM | POA: Diagnosis not present

## 2023-10-06 DIAGNOSIS — R7303 Prediabetes: Secondary | ICD-10-CM | POA: Diagnosis not present

## 2023-10-06 DIAGNOSIS — I10 Essential (primary) hypertension: Secondary | ICD-10-CM | POA: Diagnosis not present

## 2023-11-06 DIAGNOSIS — F419 Anxiety disorder, unspecified: Secondary | ICD-10-CM | POA: Diagnosis not present

## 2023-11-06 DIAGNOSIS — Z23 Encounter for immunization: Secondary | ICD-10-CM | POA: Diagnosis not present

## 2023-11-06 DIAGNOSIS — I10 Essential (primary) hypertension: Secondary | ICD-10-CM | POA: Diagnosis not present

## 2023-11-06 DIAGNOSIS — R7303 Prediabetes: Secondary | ICD-10-CM | POA: Diagnosis not present

## 2023-11-06 DIAGNOSIS — G4700 Insomnia, unspecified: Secondary | ICD-10-CM | POA: Diagnosis not present

## 2023-11-06 DIAGNOSIS — D72819 Decreased white blood cell count, unspecified: Secondary | ICD-10-CM | POA: Diagnosis not present

## 2023-11-06 DIAGNOSIS — E2839 Other primary ovarian failure: Secondary | ICD-10-CM | POA: Diagnosis not present

## 2023-11-06 DIAGNOSIS — E559 Vitamin D deficiency, unspecified: Secondary | ICD-10-CM | POA: Diagnosis not present

## 2023-11-06 DIAGNOSIS — Z0000 Encounter for general adult medical examination without abnormal findings: Secondary | ICD-10-CM | POA: Diagnosis not present

## 2023-11-07 ENCOUNTER — Other Ambulatory Visit: Payer: Self-pay | Admitting: Family Medicine

## 2023-11-07 DIAGNOSIS — E2839 Other primary ovarian failure: Secondary | ICD-10-CM

## 2023-11-07 DIAGNOSIS — Z1231 Encounter for screening mammogram for malignant neoplasm of breast: Secondary | ICD-10-CM

## 2024-05-06 DIAGNOSIS — F419 Anxiety disorder, unspecified: Secondary | ICD-10-CM | POA: Diagnosis not present

## 2024-05-06 DIAGNOSIS — I10 Essential (primary) hypertension: Secondary | ICD-10-CM | POA: Diagnosis not present

## 2024-07-22 ENCOUNTER — Ambulatory Visit: Admission: EM | Admit: 2024-07-22 | Discharge: 2024-07-22 | Disposition: A | Discharge status: Home / Self Care

## 2024-07-22 ENCOUNTER — Encounter: Payer: Self-pay | Admitting: *Deleted

## 2024-07-22 DIAGNOSIS — J101 Influenza due to other identified influenza virus with other respiratory manifestations: Secondary | ICD-10-CM | POA: Diagnosis not present

## 2024-07-22 LAB — POC COVID19/FLU A&B COMBO
Covid Antigen, POC: NEGATIVE
Influenza A Antigen, POC: POSITIVE — AB
Influenza B Antigen, POC: NEGATIVE

## 2024-07-22 MED ORDER — OSELTAMIVIR PHOSPHATE 75 MG PO CAPS: 75.0000 mg | ORAL_CAPSULE | Freq: Two times a day (BID) | ORAL | 0 refills | Status: AC

## 2024-07-22 MED ORDER — BENZONATATE 100 MG PO CAPS: 100.0000 mg | ORAL_CAPSULE | Freq: Three times a day (TID) | ORAL | 0 refills | Status: AC

## 2024-07-22 NOTE — ED Provider Notes (Signed)
 [facility]    CSN: [id number] Arrival date & time: 07/22/24  9167      History   Chief Complaint Chief Complaint  Patient presents with   Emesis   Fever    HPI Christy Griffin is a 63 y.o. female.   Patient presents today due to 2 days worth of nausea, vomiting (4 episodes of vomiting on Tuesday and 1 episode of vomiting yesterday), body aches, cough, fatigue, and fever (highest temp 101).  Patient states that she took Advil  sinus once yesterday with no significant relief and ginger tea.  The history is provided by the patient.  Emesis Associated symptoms: fever   Fever Associated symptoms: vomiting     Past Medical History:  Diagnosis Date   Anginal pain    Headache    Hypertension     Patient Active Problem List   Diagnosis Date Noted   Fibroids, intramural 08/30/2015   Right ovarian cyst 08/30/2015   Essential hypertension 08/30/2015   S/P laparoscopic assisted vaginal hysterectomy (LAVH) 08/30/2015    Past Surgical History:  Procedure Laterality Date   LAPAROSCOPIC VAGINAL HYSTERECTOMY WITH SALPINGO OOPHORECTOMY N/A 08/30/2015   Procedure: LAPAROSCOPIC ASSISTED VAGINAL HYSTERECTOMY ;  Surgeon: Rexene Hoit, MD;  Location: [facility];  Service: Gynecology;  Laterality: N/A;   SALPINGOOPHORECTOMY Bilateral 08/30/2015   Procedure: WITH BILATERAL SALPINGO OOPHORECTOMY;  Surgeon: Rexene Hoit, MD;  Location: [facility];  Service: Gynecology;  Laterality: Bilateral;   TUBAL LIGATION      OB History   No obstetric history on file.      Home Medications    Prior to Admission medications   Medication Sig Start Date End Date Taking? Authorizing Provider  benzonatate  (TESSALON ) 100 MG capsule Take 1 capsule (100 mg total) by mouth every 8 (eight) hours. 07/22/24  Yes Andra Krabbe C, PA-C  betamethasone, augmented, (DIPROLENE) 0.05 % lotion Apply topically. 07/25/22  Yes [provider]  Calcium Carb-Cholecalciferol 500-10 MG-MCG CHEW Chew by mouth.  06/21/21  Yes [provider]  ibuprofen  (ADVIL ,MOTRIN ) 800 MG tablet Take 1 tablet (800 mg total) by mouth every 8 (eight) hours as needed (mild pain). 08/31/15  Yes Hoit Rexene, MD  minoxidil (LONITEN) 2.5 MG tablet Take by mouth.   Yes [provider]  oseltamivir  (TAMIFLU ) 75 MG capsule Take 1 capsule (75 mg total) by mouth every 12 (twelve) hours. 07/22/24  Yes Andra Krabbe C, PA-C  SUMAtriptan (IMITREX) 50 MG tablet TAKE 1 TABLET BY MOUTH AS NEEDED FOR HEADACHE. MAY REPEAT ONCE IN 2 HOURS AS DIRECTED 30; Duration: 30   Yes [provider]  amoxicillin -clavulanate (AUGMENTIN ) 875-125 MG tablet Take 1 tablet by mouth every 12 (twelve) hours. Patient not taking: Reported on 07/22/2024 07/27/22   Billy Asberry FALCON, PA-C  BIOTIN PO Take 1 tablet by mouth daily. Patient not taking: Reported on 07/22/2024    [provider]  Ferrous Sulfate (IRON) 325 (65 Fe) MG TABS 1 tablet Orally Twice a day Patient not taking: Reported on 07/22/2024    [provider]  losartan  (COZAAR ) 25 MG tablet Take 25 mg by mouth daily. Patient not taking: Reported on 07/22/2024    [provider]  losartan -hydrochlorothiazide  (HYZAAR ) 50-12.5 MG tablet Take 0.5 tablets by mouth daily. Patient not taking: Reported on 07/22/2024    [provider]  mometasone  (ELOCON ) 0.1 % cream Apply 1 Application topically daily. Patient not taking: Reported on 07/22/2024 07/27/22   Billy Asberry FALCON, PA-C    Family  History Family History  Problem Relation Age of Onset   Breast cancer Neg Hx    BRCA 1/2 Neg Hx     Social History Social History   Tobacco Use   Smoking status: Never   Smokeless tobacco: Never  Substance Use Topics   Alcohol use: No   Drug use: No     Allergies   Rubbing alcohol [alcohol]   Review of Systems Review of Systems  Constitutional:  Positive for fever.  Gastrointestinal:  Positive for vomiting.     Physical Exam Triage Vital  Signs ED Triage Vitals  Encounter Vitals Group     BP 07/22/24 0855 (!) 143/97     Girls Systolic BP Percentile --      Girls Diastolic BP Percentile --      Boys Systolic BP Percentile --      Boys Diastolic BP Percentile --      Pulse Rate 07/22/24 0855 97     Resp 07/22/24 0855 18     Temp 07/22/24 0855 99.4 F (37.4 C)     Temp Source 07/22/24 0855 Oral     SpO2 07/22/24 0855 97 %     Weight --      Height --      Head Circumference --      Peak Flow --      Pain Score 07/22/24 0850 6     Pain Loc --      Pain Education --      Exclude from Growth Chart --    No data found.  Updated Vital Signs BP (!) 143/97 (BP Location: Left Arm)   Pulse 97   Temp 99.4 F (37.4 C) (Oral)   Resp 18   SpO2 97%   Visual Acuity Right Eye Distance:   Left Eye Distance:   Bilateral Distance:    Right Eye Near:   Left Eye Near:    Bilateral Near:     Physical Exam Vitals and nursing note reviewed.  Constitutional:      General: She is not in acute distress.    Appearance: Normal appearance. She is ill-appearing. She is not toxic-appearing or diaphoretic.  HENT:     Nose: Congestion (moderately enlarged) present. No rhinorrhea.     Mouth/Throat:     Mouth: Mucous membranes are moist.     Pharynx: Oropharynx is clear. No oropharyngeal exudate or posterior oropharyngeal erythema.  Eyes:     General: No scleral icterus. Cardiovascular:     Rate and Rhythm: Normal rate and regular rhythm.     Heart sounds: Normal heart sounds.  Pulmonary:     Effort: Pulmonary effort is normal. No respiratory distress.     Breath sounds: Normal breath sounds. No wheezing or rhonchi.  Skin:    General: Skin is warm.  Neurological:     Mental Status: She is alert and oriented to person, place, and time.  Psychiatric:        Mood and Affect: Mood normal.        Behavior: Behavior normal.      UC Treatments / Results  Labs (all labs ordered are listed, but only abnormal results are  displayed) Labs Reviewed  POC COVID19/FLU A&B COMBO - Abnormal; Notable for the following components:      Result Value   Influenza A Antigen, POC Positive (*)    All other components within normal limits    EKG   Radiology No results found.  Procedures Procedures (including  critical care time)  Medications Ordered in UC Medications - No data to display  Initial Impression / Assessment and Plan / UC Course  I have reviewed the triage vital signs and the nursing notes.  Pertinent labs & imaging results that were available during my care of the patient were reviewed by me and considered in my medical decision making (see chart for details).    Final Clinical Impressions(s) / UC Diagnoses   Final diagnoses:  Influenza A     Discharge Instructions      You been diagnosed with a viral illness today. -Viruses have to run their course and medicines that are prescribed are meant to help with symptoms. - With viruses usually feel poorly from 3 to 7 days with cough being the last symptoms to resolve.  -Cough can linger from days to weeks.  Antibiotics are not effective for viruses. -If your cough lasts more than 2 weeks and you are coughing so hard that you are vomiting or feel like you could pass out we need to follow-up with PCP for further testing and evaluation. -Rest, increase water intake, may use pseudoephedrine for nasal congestion, Delsym (dextromethorphan) or honey as needed for cough, and ibuprofen  and/or Tylenol  as directed on packaging for pain and fever. -If you have hypertension you should take Coricidin or other OTC meds approved for people with high blood pressure. -You may use a spoonful of honey every 4-6 hours as needed for throat pain and cough. -Warm tea with honey and lemon are helpful for soothe throat as well.  Chloraseptic and Cepacol make a throat lozenge with numbing medication, can be purchased over-the-counter. -May also use Flonase or sinus rinse for  sinus pressure or nasal congestion.  Be sure to use distilled bottled water for sinus rinses. -May use coolmist humidifier to open up nasal passages -May elevate head to assist with postnasal drainage. -If you feel poorly (fever, fatigue, shortness of breath, nausea, etc.) for more than 10 days to be sure to follow-up with PCP or in clinic for further evaluation and additional treatments. If you experience chest pain with shortness of breath or pulse oxygen less than 95% you should report to the ER.     ED Prescriptions     Medication Sig Dispense Auth. Provider   oseltamivir (TAMIFLU) 75 MG capsule Take 1 capsule (75 mg total) by mouth every 12 (twelve) hours. 10 capsule Andra Krabbe C, PA-C   benzonatate (TESSALON) 100 MG capsule Take 1 capsule (100 mg total) by mouth every 8 (eight) hours. 30 capsule Andra Krabbe BROCKS, PA-C      PDMP not reviewed this encounter.   Andra Krabbe BROCKS, PA-C 07/22/24 [phone]

## 2024-07-22 NOTE — Discharge Instructions (Addendum)

## 2024-07-22 NOTE — ED Triage Notes (Signed)
 States sick since Tuesday. Vomiting and chills Tuesday. No emesis since yesterday morning. She states he stayed in bed yesterday, took advil  once. Fever yesterday 101. She took her BP meds this morning.

## 2024-08-06 ENCOUNTER — Inpatient Hospital Stay: Admission: RE | Admit: 2024-08-06 | Discharge: 2024-08-06 | Attending: Family Medicine | Admitting: Family Medicine

## 2024-08-06 ENCOUNTER — Other Ambulatory Visit

## 2024-08-06 DIAGNOSIS — Z1231 Encounter for screening mammogram for malignant neoplasm of breast: Secondary | ICD-10-CM

## 2024-08-06 DIAGNOSIS — E2839 Other primary ovarian failure: Secondary | ICD-10-CM
